# Patient Record
Sex: Male | Born: 1980
Health system: Southern US, Community
[De-identification: ages and names within clinical notes are randomized; demographics above are authoritative.]

## PROBLEM LIST (undated history)

## (undated) DIAGNOSIS — M109 Gout, unspecified: Secondary | ICD-10-CM

## (undated) DIAGNOSIS — E1169 Type 2 diabetes mellitus with other specified complication: Secondary | ICD-10-CM

## (undated) DIAGNOSIS — E785 Hyperlipidemia, unspecified: Secondary | ICD-10-CM

## (undated) DIAGNOSIS — Z0000 Encounter for general adult medical examination without abnormal findings: Principal | ICD-10-CM

## (undated) DIAGNOSIS — T7840XA Allergy, unspecified, initial encounter: Secondary | ICD-10-CM

## (undated) DIAGNOSIS — M1120 Other chondrocalcinosis, unspecified site: Secondary | ICD-10-CM

## (undated) DIAGNOSIS — E119 Type 2 diabetes mellitus without complications: Secondary | ICD-10-CM

## (undated) HISTORY — DX: Hyperlipidemia, unspecified: E78.5

## (undated) HISTORY — DX: Encounter for general adult medical examination without abnormal findings: Z00.00

## (undated) HISTORY — PX: KNEE SURGERY: SHX244

## (undated) HISTORY — DX: Gout, unspecified: M10.9

## (undated) HISTORY — DX: Other chondrocalcinosis, unspecified site: M11.20

## (undated) HISTORY — DX: Allergy, unspecified, initial encounter: T78.40XA

## (undated) HISTORY — DX: Type 2 diabetes mellitus with other specified complication: E11.69

## (undated) HISTORY — DX: Type 2 diabetes mellitus without complications: E11.9

---

## 2008-03-02 ENCOUNTER — Ambulatory Visit: Payer: Self-pay | Admitting: Internal Medicine

## 2008-03-02 LAB — CONVERTED CEMR LAB
ALT: 27 units/L (ref 0–53)
AST: 21 units/L (ref 0–37)
Albumin: 4.3 g/dL (ref 3.5–5.2)
Alkaline Phosphatase: 50 units/L (ref 39–117)
BUN: 7 mg/dL (ref 6–23)
Basophils Relative: 0.5 % (ref 0.0–1.0)
Bilirubin Urine: NEGATIVE
CO2: 29 meq/L (ref 19–32)
Calcium: 9.5 mg/dL (ref 8.4–10.5)
Chloride: 106 meq/L (ref 96–112)
Creatinine, Ser: 1.1 mg/dL (ref 0.4–1.5)
Hemoglobin: 15.7 g/dL (ref 13.0–17.0)
Ketones, ur: NEGATIVE mg/dL
Leukocytes, UA: NEGATIVE
Monocytes Absolute: 0.7 10*3/uL (ref 0.2–0.7)
Monocytes Relative: 9.1 % (ref 3.0–11.0)
Potassium: 3.9 meq/L (ref 3.5–5.1)
RBC: 5.25 M/uL (ref 4.22–5.81)
RDW: 12.2 % (ref 11.5–14.6)
Specific Gravity, Urine: 1.02 (ref 1.000–1.03)
Total Bilirubin: 1.7 mg/dL — ABNORMAL HIGH (ref 0.3–1.2)
Total Protein, Urine: NEGATIVE mg/dL
Total Protein: 7.4 g/dL (ref 6.0–8.3)
Urobilinogen, UA: 0.2 (ref 0.0–1.0)
VLDL: 25 mg/dL (ref 0–40)
pH: 6 (ref 5.0–8.0)

## 2008-03-16 ENCOUNTER — Ambulatory Visit: Payer: Self-pay | Admitting: Internal Medicine

## 2009-04-21 ENCOUNTER — Ambulatory Visit: Payer: Self-pay | Admitting: Internal Medicine

## 2009-04-21 DIAGNOSIS — K625 Hemorrhage of anus and rectum: Secondary | ICD-10-CM | POA: Insufficient documentation

## 2009-05-31 ENCOUNTER — Ambulatory Visit: Payer: Self-pay | Admitting: Internal Medicine

## 2009-05-31 LAB — CONVERTED CEMR LAB
Fecal Occult Blood: NEGATIVE
OCCULT 3: NEGATIVE
OCCULT 4: NEGATIVE
OCCULT 5: NEGATIVE

## 2009-06-01 ENCOUNTER — Encounter: Payer: Self-pay | Admitting: Internal Medicine

## 2009-08-26 ENCOUNTER — Ambulatory Visit: Payer: Self-pay | Admitting: Diagnostic Radiology

## 2009-08-26 ENCOUNTER — Emergency Department (HOSPITAL_BASED_OUTPATIENT_CLINIC_OR_DEPARTMENT_OTHER): Admission: EM | Admit: 2009-08-26 | Discharge: 2009-08-26 | Payer: Self-pay | Admitting: Emergency Medicine

## 2009-08-30 ENCOUNTER — Ambulatory Visit: Payer: Self-pay | Admitting: Internal Medicine

## 2009-08-30 DIAGNOSIS — J45909 Unspecified asthma, uncomplicated: Secondary | ICD-10-CM | POA: Insufficient documentation

## 2009-09-30 ENCOUNTER — Ambulatory Visit: Payer: Self-pay | Admitting: Internal Medicine

## 2009-10-17 ENCOUNTER — Ambulatory Visit: Payer: Self-pay | Admitting: Internal Medicine

## 2009-10-19 ENCOUNTER — Encounter: Payer: Self-pay | Admitting: Internal Medicine

## 2009-11-18 ENCOUNTER — Ambulatory Visit: Payer: Self-pay | Admitting: Internal Medicine

## 2009-11-21 ENCOUNTER — Telehealth: Payer: Self-pay | Admitting: Internal Medicine

## 2011-04-18 ENCOUNTER — Encounter: Payer: Self-pay | Admitting: Internal Medicine

## 2011-04-18 ENCOUNTER — Ambulatory Visit: Payer: Self-pay | Admitting: Internal Medicine

## 2011-04-19 ENCOUNTER — Encounter: Payer: Self-pay | Admitting: Internal Medicine

## 2011-04-19 ENCOUNTER — Ambulatory Visit (INDEPENDENT_AMBULATORY_CARE_PROVIDER_SITE_OTHER): Payer: 59 | Admitting: Internal Medicine

## 2011-04-19 DIAGNOSIS — J45909 Unspecified asthma, uncomplicated: Secondary | ICD-10-CM

## 2011-04-19 DIAGNOSIS — R7309 Other abnormal glucose: Secondary | ICD-10-CM

## 2011-04-19 DIAGNOSIS — Z0000 Encounter for general adult medical examination without abnormal findings: Secondary | ICD-10-CM

## 2011-04-19 DIAGNOSIS — E785 Hyperlipidemia, unspecified: Secondary | ICD-10-CM

## 2011-04-19 DIAGNOSIS — Z833 Family history of diabetes mellitus: Secondary | ICD-10-CM

## 2011-04-19 LAB — LIPID PANEL
Cholesterol: 179 mg/dL (ref 0–200)
Total CHOL/HDL Ratio: 3.8 Ratio

## 2011-04-19 NOTE — Patient Instructions (Signed)
Follow up as needed

## 2011-04-20 ENCOUNTER — Other Ambulatory Visit: Payer: Self-pay | Admitting: Internal Medicine

## 2011-04-20 ENCOUNTER — Telehealth: Payer: Self-pay | Admitting: *Deleted

## 2011-04-20 DIAGNOSIS — R7309 Other abnormal glucose: Secondary | ICD-10-CM

## 2011-04-20 LAB — BASIC METABOLIC PANEL WITH GFR
BUN: 14 mg/dL (ref 6–23)
Calcium: 9.2 mg/dL (ref 8.4–10.5)
Creat: 1.35 mg/dL (ref 0.40–1.50)
GFR, Est Non African American: >60 mL/min (ref >60)

## 2011-04-20 LAB — HEMOGLOBIN A1C

## 2011-04-20 NOTE — Telephone Encounter (Signed)
Received call from [place] at [facility] stating they did not receive the lavender tube for the Hgb A1c that was ordered on pt yesterday. I asked her to please check their storage areas again and let me know the outcome. Spoke to Troy at the [facility] and he states he remembers drawing the specimen.

## 2011-04-24 NOTE — Telephone Encounter (Signed)
Left message with [place] to have Archie Patten return my call with status of specimen.

## 2011-04-25 NOTE — Telephone Encounter (Signed)
Spoke to [employer] at [place] that states they did not find the lavender tube for hgba1c ordered on 04/19/11. Darlene was previously informed by the lab that they would call the pt for redraw.

## 2011-05-03 DIAGNOSIS — Z0000 Encounter for general adult medical examination without abnormal findings: Secondary | ICD-10-CM | POA: Insufficient documentation

## 2011-05-03 DIAGNOSIS — Z833 Family history of diabetes mellitus: Secondary | ICD-10-CM | POA: Insufficient documentation

## 2011-05-03 NOTE — Progress Notes (Signed)
  Subjective:    Patient ID: Steven Powell, male    DOB: [birth year], 30 y.o.   MRN: [id number]  HPI  30 y/o male with hx of RAD for routine follow He denies symptoms of asthma.  No wheezing, cough, or dyspnea.  He exercises on regular basis.  Fam hx of diabetes.  He is prudent with carb intake.    Review of Systems     Constitutional: Negative for activity change, appetite change and unexpected weight change.  Eyes: Negative for visual disturbance.  Respiratory: Negative for cough, chest tightness and shortness of breath.   Cardiovascular: Negative for chest pain.  Genitourinary: Negative for difficulty urinating.  Neurological: Negative for headaches.  Gastrointestinal: Negative for abdominal pain, heartburn melena or hematochezia Psych: Negative for depression or anxiety Endo:  No polyuria or polydypsia     Past Medical History  Diagnosis Date  . Asthma     childhood    History   Social History  . Marital Status: Single    Spouse Name: N/A    Number of Children: N/A  . Years of Education: N/A   Occupational History  . Not on file.   Social History Main Topics  . Smoking status: Never Smoker   . Smokeless tobacco: Not on file  . Alcohol Use: Not on file  . Drug Use: Not on file  . Sexually Active: Not on file   Other Topics Concern  . Not on file   Social History Narrative   Occupation: currently working for [employer] Alcohol use-yes (social)    No past surgical history on file.  Family History  Problem Relation Age of Onset  . Diabetes    . Hyperlipidemia    . Hypertension      No Known Allergies  Current Outpatient Prescriptions on File Prior to Visit  Medication Sig Dispense Refill  . albuterol (PROVENTIL HFA) 108 (90 BASE) MCG/ACT inhaler Inhale 2 puffs into the lungs every 4 (four) hours as needed.        . beclomethasone (QVAR) 40 MCG/ACT inhaler Inhale 2 puffs into the lungs 2 (two) times daily.          BP 110/80   Pulse 85  Temp(Src) 98.1 F (36.7 C) (Oral)  Resp 18  Ht 5\' 4"  (1.626 m)  Wt 170 lb (77.111 kg)  BMI 29.18 kg/m2    Objective:   Physical Exam  Constitutional: Appears well-developed and well-nourished. No distress.  HENT:  Head: Normocephalic and atraumatic.  Right Ear: External ear normal.  Left Ear: External ear normal.  Mouth/Throat: Oropharynx is clear and moist.  Eyes: Conjunctivae are normal. Pupils are equal, round, and reactive to light.  Neck: Normal range of motion. Neck supple. No thyromegaly present.       No carotid bruit  Cardiovascular: Normal rate, regular rhythm and normal heart sounds.  Exam reveals no gallop and no friction rub.   No murmur heard. Pulmonary/Chest: Effort normal and breath sounds normal. He has no wheezes. He has no rales.  Abdominal: Soft. Bowel sounds are normal. He exhibits no mass. There is no tenderness.  Neurological: Alert. No cranial nerve deficit.  Skin: Skin is warm and dry.  Psychiatric: Normal mood and affect. Behavior is normal.         Assessment & Plan:

## 2011-05-03 NOTE — Assessment & Plan Note (Signed)
Reviewed adult health maintenance protocols.  

## 2011-05-03 NOTE — Assessment & Plan Note (Signed)
Encouraged healthy diet and regular exercise   

## 2011-05-03 NOTE — Assessment & Plan Note (Signed)
Resolved

## 2011-05-14 ENCOUNTER — Telehealth: Payer: Self-pay | Admitting: Internal Medicine

## 2011-05-14 NOTE — Telephone Encounter (Signed)
Copy of labs mailed to patients address on file.

## 2011-05-14 NOTE — Telephone Encounter (Signed)
Pt requesting last lab work results to be mailed to him.

## 2011-10-05 ENCOUNTER — Encounter: Payer: Self-pay | Admitting: Internal Medicine

## 2011-10-05 ENCOUNTER — Ambulatory Visit (INDEPENDENT_AMBULATORY_CARE_PROVIDER_SITE_OTHER): Payer: 59 | Admitting: Internal Medicine

## 2011-10-05 DIAGNOSIS — R109 Unspecified abdominal pain: Secondary | ICD-10-CM

## 2011-10-05 DIAGNOSIS — R1030 Lower abdominal pain, unspecified: Secondary | ICD-10-CM

## 2011-10-05 NOTE — Progress Notes (Signed)
  Subjective:    Patient ID: Steven Powell, male    DOB: 1981/04/07, 30 y.o.   MRN: [id number]  HPI Pt presents to clinic for evaluation of groin pain. Notes 4d h/o left groin pain rarely (twice) radiating to left scrotum. Pain began after exercising abdominal muscles and leg lifts. Denies any obvious mass and has no persistent scrotal or testicular pain. Denies abdominal pain, fever, chills, nausea, vomitting or urinary sx's such as dysuria, frequency or hematuria. Describes pain as mild 3/10. Has improved mildly since onset. Taking no medication for the problem. No other alleviating or exacerbating factors. No other complaints.  Past Medical History  Diagnosis Date  . Asthma     childhood   No past surgical history on file.  reports that he has never smoked. He has never used smokeless tobacco. He reports that he drinks alcohol. He reports that he does not use illicit drugs. family history includes Diabetes in an unspecified family member; Hyperlipidemia in an unspecified family member; and Hypertension in an unspecified family member. No Known Allergies    Review of Systems see hpi     Objective:   Physical Exam  Nursing note and vitals reviewed. Constitutional: He appears well-developed and well-nourished. No distress.  HENT:  Head: Normocephalic and atraumatic.  Eyes: Conjunctivae are normal. No scleral icterus.  Genitourinary:       Bilaterally descended testes without nodularity or tenderness. No scrotal mass. No canal hernia noted on valsalva. No inguinal adenopathy or tenderness. +mild firm area just left of bladder which represents area of pain. No mass on valsalva.  Skin: Skin is warm and dry. He is not diaphoretic.  Psychiatric: He has a normal mood and affect.          Assessment & Plan:

## 2011-10-05 NOTE — Assessment & Plan Note (Signed)
UA obtained and nl. No obvious hernia or mass on exam. Consider msk etiology given timing of onset. Recommend observation without further exercise for one week. If sx's worsen or do not improve over that time period recommend CT imaging.

## 2011-11-15 ENCOUNTER — Ambulatory Visit (INDEPENDENT_AMBULATORY_CARE_PROVIDER_SITE_OTHER): Payer: 59 | Admitting: Internal Medicine

## 2011-11-15 ENCOUNTER — Ambulatory Visit (HOSPITAL_BASED_OUTPATIENT_CLINIC_OR_DEPARTMENT_OTHER)
Admission: RE | Admit: 2011-11-15 | Discharge: 2011-11-15 | Disposition: A | Discharge status: Home / Self Care | Payer: 59 | Source: Ambulatory Visit | Attending: Internal Medicine | Admitting: Internal Medicine

## 2011-11-15 ENCOUNTER — Encounter: Payer: Self-pay | Admitting: Internal Medicine

## 2011-11-15 VITALS — BP 100/60 | HR 82 | Temp 98.5°F | Resp 18 | Ht 64.0 in | Wt 178.0 lb

## 2011-11-15 DIAGNOSIS — M25579 Pain in unspecified ankle and joints of unspecified foot: Secondary | ICD-10-CM | POA: Insufficient documentation

## 2011-11-15 MED ORDER — DICLOFENAC SODIUM 75 MG PO TBEC: DELAYED_RELEASE_TABLET | ORAL | Status: AC

## 2011-11-27 DIAGNOSIS — M25579 Pain in unspecified ankle and joints of unspecified foot: Secondary | ICD-10-CM | POA: Insufficient documentation

## 2011-11-27 NOTE — Progress Notes (Signed)
  Subjective:    Patient ID: Steven Powell, male    DOB: 1981/04/08, 30 y.o.   MRN: [id number]  HPI Pt presents to clinic for evalution of ankle pain. Notes recent left ankle pain that occurred after running/exercise. No injury/trauma. attemped intermittent otc nsaid. No instability. Also notes chronic intermittent mild right ankle pain without injury. No other alleviating or exacerbating factors. No other complaints.  Past Medical History  Diagnosis Date  . Asthma     childhood   No past surgical history on file.  reports that he has never smoked. He has never used smokeless tobacco. He reports that he drinks alcohol. He reports that he does not use illicit drugs. family history includes Diabetes in an unspecified family member; Hyperlipidemia in an unspecified family member; and Hypertension in an unspecified family member. No Known Allergies     Review of Systems see hpi     Objective:   Physical Exam  Nursing note and vitals reviewed. Constitutional: He appears well-developed and well-nourished. No distress.  HENT:  Head: Normocephalic and atraumatic.  Eyes: Conjunctivae are normal. No scleral icterus.  Musculoskeletal:       Gait nl. Left lateral malleous with mild tenderness over distal fibula. No ST swelling, erythema or warmth.   Neurological: He is alert.  Skin: Skin is warm and dry. He is not diaphoretic.  Psychiatric: He has a normal mood and affect.          Assessment & Plan:

## 2011-11-27 NOTE — Assessment & Plan Note (Signed)
Obtain plain radiographs of ankles. Attempt nsaid with food and no other nsaids. Followup if no improvement or worsening.

## 2012-12-31 HISTORY — PX: KNEE SURGERY: SHX244

## 2013-02-18 ENCOUNTER — Encounter: Payer: Self-pay | Admitting: Internal Medicine

## 2013-02-18 ENCOUNTER — Ambulatory Visit (INDEPENDENT_AMBULATORY_CARE_PROVIDER_SITE_OTHER): Payer: 59 | Admitting: Internal Medicine

## 2013-02-18 VITALS — BP 112/84 | Temp 99.0°F | Wt 181.0 lb

## 2013-02-18 DIAGNOSIS — R52 Pain, unspecified: Secondary | ICD-10-CM

## 2013-02-18 DIAGNOSIS — J069 Acute upper respiratory infection, unspecified: Secondary | ICD-10-CM | POA: Insufficient documentation

## 2013-02-18 LAB — POCT INFLUENZA A/B
Influenza A, POC: NEGATIVE
Influenza B, POC: NEGATIVE

## 2013-02-18 MED ORDER — HYDROCODONE-HOMATROPINE 5-1.5 MG/5ML PO SYRP: 5.0000 mL | ORAL_SOLUTION | Freq: Three times a day (TID) | ORAL | Status: DC | PRN

## 2013-02-18 NOTE — Progress Notes (Signed)
  Subjective:    Patient ID: Steven Powell, male    DOB: 05-Apr-1981, 32 y.o.   MRN: [id number]  HPI  32 year old Asian male with history of reactive airway disease complains of nasal congestion, cough and stuffy nose over last 1-2 days. Patient also has associated fatigue and skin feels sensitive. He is experienced mild chills and low-grade fever. He denies any sick contacts.   Review of Systems Negative for shortness of breath  Past Medical History  Diagnosis Date  . Asthma     childhood    History   Social History  . Marital Status: Single    Spouse Name: N/A    Number of Children: N/A  . Years of Education: N/A   Occupational History  . Not on file.   Social History Main Topics  . Smoking status: Never Smoker   . Smokeless tobacco: Never Used  . Alcohol Use: Yes  . Drug Use: No  . Sexually Active: Not on file   Other Topics Concern  . Not on file   Social History Narrative   Occupation: currently working for [employer]   Single   Never Smoked    Alcohol use-yes (social)             No past surgical history on file.  Family History  Problem Relation Age of Onset  . Diabetes    . Hyperlipidemia    . Hypertension      No Known Allergies  No current outpatient prescriptions on file prior to visit.   No current facility-administered medications on file prior to visit.    BP 112/84  Temp(Src) 99 F (37.2 C) (Oral)  Wt 181 lb (82.101 kg)  BMI 31.05 kg/m2       Objective:   Physical Exam  Constitutional: He appears well-developed and well-nourished.  HENT:  Head: Normocephalic and atraumatic.  Right and left tympanic membranes slightly retracted, oropharyngeal erythema, oropharyngeal crowding  Neck: Neck supple.  No neck tenderness  Cardiovascular: Normal rate, regular rhythm and normal heart sounds.   Pulmonary/Chest: Effort normal and breath sounds normal. He has no wheezes.  Abdominal: Soft. Bowel sounds are normal. There is no tenderness.   Lymphadenopathy:    He has no cervical adenopathy.  Psychiatric: He has a normal mood and affect.          Assessment & Plan:

## 2013-02-18 NOTE — Patient Instructions (Addendum)
Gargle with warm salt water 2-3 times per day Use nasal saline spray as directed Please contact our office if your symptoms do not improve or gets worse.

## 2013-02-18 NOTE — Assessment & Plan Note (Signed)
32 year old Asian male with signs and symptoms of viral upper respiratory infection. I recommended symptomatic treatment. Use hycodan for cough.  Patient advised to call office if symptoms persist or worsen.

## 2014-08-30 ENCOUNTER — Ambulatory Visit (INDEPENDENT_AMBULATORY_CARE_PROVIDER_SITE_OTHER): Payer: 59 | Admitting: Physician Assistant

## 2014-08-30 ENCOUNTER — Other Ambulatory Visit: Payer: Self-pay | Admitting: Physician Assistant

## 2014-08-30 ENCOUNTER — Encounter: Payer: Self-pay | Admitting: Physician Assistant

## 2014-08-30 VITALS — BP 115/70 | HR 81 | Temp 98.0°F | Wt 188.0 lb

## 2014-08-30 DIAGNOSIS — S99919A Unspecified injury of unspecified ankle, initial encounter: Secondary | ICD-10-CM

## 2014-08-30 DIAGNOSIS — Z5189 Encounter for other specified aftercare: Secondary | ICD-10-CM

## 2014-08-30 DIAGNOSIS — S8992XD Unspecified injury of left lower leg, subsequent encounter: Secondary | ICD-10-CM

## 2014-08-30 DIAGNOSIS — S8990XA Unspecified injury of unspecified lower leg, initial encounter: Secondary | ICD-10-CM

## 2014-08-30 DIAGNOSIS — S99929A Unspecified injury of unspecified foot, initial encounter: Secondary | ICD-10-CM

## 2014-08-30 NOTE — Progress Notes (Signed)
Patient presents to clinic today c/o chronic intermittent left knee pain after a twisting injury sustained 2 months ago.  Patient endorses twisting knee during a fall.  Had some mild immediate pain.  Was evaluated at an Urgent Care.  X-rays were obtained at that visit but were unremarkable.  Patient stated pain gradually presented itself over the next few days after injury. Endorses some associated swelling. Denies ecchymosis. Pain and swelling had improved. Patient now with intermittent pain and crepitus of left knee. Denies recurrent joint effusion. Is concerned, because he has been favoring his right leg and is now starting to have some right hip discomfort. Patient has not taken anything for his symptoms.  Past Medical History  Diagnosis Date  . Asthma     childhood    Current Outpatient Prescriptions on File Prior to Visit  Medication Sig Dispense Refill  . HYDROcodone-homatropine (HYCODAN) 5-1.5 MG/5ML syrup Take 5 mLs by mouth every 8 (eight) hours as needed for cough.  120 mL  0   No current facility-administered medications on file prior to visit.    No Known Allergies  Family History  Problem Relation Age of Onset  . Diabetes    . Hyperlipidemia    . Hypertension      History   Social History  . Marital Status: Single    Spouse Name: N/A    Number of Children: N/A  . Years of Education: N/A   Social History Main Topics  . Smoking status: Never Smoker   . Smokeless tobacco: Never Used  . Alcohol Use: Yes  . Drug Use: No  . Sexual Activity: Not on file   Other Topics Concern  . Not on file   Social History Narrative   Occupation: currently working for [employer]   Single   Never Smoked    Alcohol use-yes (social)            Review of Systems - See HPI.  All other ROS are negative.  BP 115/70  Pulse 81  Temp(Src) 98 F (36.7 C)  Wt 188 lb (85.276 kg)  SpO2 99%  Physical Exam  Vitals reviewed. Constitutional: He is oriented to person, place, and time  and well-developed, well-nourished, and in no distress.  HENT:  Head: Normocephalic and atraumatic.  Eyes: Conjunctivae are normal.  Cardiovascular: Normal rate, regular rhythm, normal heart sounds and intact distal pulses.   Pulmonary/Chest: Effort normal and breath sounds normal. No respiratory distress. He has no wheezes.  Musculoskeletal:       Right knee: Normal.       Left knee: He exhibits abnormal meniscus. He exhibits normal range of motion, no swelling, no effusion, no ecchymosis, normal alignment, no LCL laxity, no bony tenderness and no MCL laxity. Tenderness found. Medial joint line tenderness noted. No lateral joint line, no MCL, no LCL and no patellar tendon tenderness noted.       Right ankle: Normal.       Left ankle: Normal.  Neurological: He is alert and oriented to person, place, and time.  Skin: Skin is warm and dry. No rash noted.  Psychiatric: Affect normal.   No results found for this or any previous visit (from the past 2160 hour(s)).  Assessment/Plan: Knee injury Concern for possible meniscal tear.  Knee brace to wear daily.  Keep leg elevated.  Tylenol or Ibuprofen if needed for pain.  Will obtain MRI to further assess joint.  Will likely need referral to Orthopedic Surgery.

## 2014-08-30 NOTE — Assessment & Plan Note (Signed)
Concern for possible meniscal tear.  Knee brace to wear daily.  Keep leg elevated.  Tylenol or Ibuprofen if needed for pain.  Will obtain MRI to further assess joint.  Will likely need referral to Orthopedic Surgery.

## 2014-08-30 NOTE — Progress Notes (Signed)
Pre visit review using our clinic review tool, if applicable. No additional management support is needed unless otherwise documented below in the visit note. 

## 2014-08-30 NOTE — Patient Instructions (Signed)
Please wear supportive knee brace.  Wear supportive tennis shoes.  Avoid overexertion and resistance training on the knees.  You will be contacted to schedule an MRI to further evaluate.  Take tylenol or Ibuprofen for pian. Keep leg elevated while resting at home.

## 2014-09-01 ENCOUNTER — Ambulatory Visit (INDEPENDENT_AMBULATORY_CARE_PROVIDER_SITE_OTHER): Payer: 59

## 2014-09-01 ENCOUNTER — Telehealth: Payer: Self-pay | Admitting: Physician Assistant

## 2014-09-01 DIAGNOSIS — X58XXXA Exposure to other specified factors, initial encounter: Secondary | ICD-10-CM

## 2014-09-01 DIAGNOSIS — S83242A Other tear of medial meniscus, current injury, left knee, initial encounter: Secondary | ICD-10-CM

## 2014-09-01 DIAGNOSIS — IMO0002 Reserved for concepts with insufficient information to code with codable children: Secondary | ICD-10-CM

## 2014-09-01 DIAGNOSIS — S8992XD Unspecified injury of left lower leg, subsequent encounter: Secondary | ICD-10-CM

## 2014-09-01 NOTE — Telephone Encounter (Signed)
Pt notified  Pt verbalized understanding

## 2014-09-01 NOTE — Telephone Encounter (Signed)
MRI shows significant medial meniscus tear.  Avoid overexertion.  Continue knee brace.  I am setting him up with an Orthopedic Surgeon for further evaluation and treatment.

## 2014-09-04 ENCOUNTER — Ambulatory Visit (HOSPITAL_BASED_OUTPATIENT_CLINIC_OR_DEPARTMENT_OTHER): Payer: 59

## 2014-09-24 ENCOUNTER — Ambulatory Visit: Payer: 59 | Admitting: Family Medicine

## 2014-11-16 ENCOUNTER — Ambulatory Visit: Payer: 59 | Attending: Orthopedic Surgery | Admitting: Physical Therapy

## 2014-11-16 DIAGNOSIS — R269 Unspecified abnormalities of gait and mobility: Secondary | ICD-10-CM | POA: Diagnosis not present

## 2014-11-16 DIAGNOSIS — M25562 Pain in left knee: Secondary | ICD-10-CM | POA: Diagnosis not present

## 2014-11-16 DIAGNOSIS — Z96652 Presence of left artificial knee joint: Secondary | ICD-10-CM | POA: Diagnosis not present

## 2014-11-16 DIAGNOSIS — M25662 Stiffness of left knee, not elsewhere classified: Secondary | ICD-10-CM | POA: Diagnosis not present

## 2014-11-16 DIAGNOSIS — M6281 Muscle weakness (generalized): Secondary | ICD-10-CM | POA: Insufficient documentation

## 2014-12-01 ENCOUNTER — Ambulatory Visit: Payer: 59 | Attending: Orthopedic Surgery | Admitting: Rehabilitation

## 2014-12-01 DIAGNOSIS — M6281 Muscle weakness (generalized): Secondary | ICD-10-CM | POA: Insufficient documentation

## 2014-12-01 DIAGNOSIS — R269 Unspecified abnormalities of gait and mobility: Secondary | ICD-10-CM | POA: Insufficient documentation

## 2014-12-01 DIAGNOSIS — M25662 Stiffness of left knee, not elsewhere classified: Secondary | ICD-10-CM | POA: Diagnosis not present

## 2014-12-01 DIAGNOSIS — Z96652 Presence of left artificial knee joint: Secondary | ICD-10-CM | POA: Diagnosis not present

## 2014-12-01 DIAGNOSIS — M25562 Pain in left knee: Secondary | ICD-10-CM | POA: Diagnosis present

## 2014-12-08 ENCOUNTER — Encounter: Payer: 59 | Admitting: Physical Therapy

## 2014-12-15 ENCOUNTER — Encounter: Payer: 59 | Admitting: Physical Therapy

## 2014-12-22 ENCOUNTER — Encounter: Payer: 59 | Admitting: Rehabilitation

## 2016-02-14 ENCOUNTER — Encounter (HOSPITAL_COMMUNITY): Payer: Self-pay | Admitting: Emergency Medicine

## 2016-02-14 ENCOUNTER — Emergency Department (HOSPITAL_COMMUNITY): Payer: 59

## 2016-02-14 ENCOUNTER — Emergency Department (HOSPITAL_COMMUNITY)
Admission: EM | Admit: 2016-02-14 | Discharge: 2016-02-14 | Disposition: A | Discharge status: Home / Self Care | Payer: 59 | Attending: Emergency Medicine | Admitting: Emergency Medicine

## 2016-02-14 DIAGNOSIS — S8992XA Unspecified injury of left lower leg, initial encounter: Secondary | ICD-10-CM | POA: Diagnosis not present

## 2016-02-14 DIAGNOSIS — M25462 Effusion, left knee: Secondary | ICD-10-CM | POA: Insufficient documentation

## 2016-02-14 DIAGNOSIS — Y9389 Activity, other specified: Secondary | ICD-10-CM | POA: Insufficient documentation

## 2016-02-14 DIAGNOSIS — X58XXXA Exposure to other specified factors, initial encounter: Secondary | ICD-10-CM | POA: Diagnosis not present

## 2016-02-14 DIAGNOSIS — M25562 Pain in left knee: Secondary | ICD-10-CM

## 2016-02-14 DIAGNOSIS — Z9889 Other specified postprocedural states: Secondary | ICD-10-CM | POA: Insufficient documentation

## 2016-02-14 DIAGNOSIS — J45909 Unspecified asthma, uncomplicated: Secondary | ICD-10-CM | POA: Insufficient documentation

## 2016-02-14 DIAGNOSIS — Y998 Other external cause status: Secondary | ICD-10-CM | POA: Diagnosis not present

## 2016-02-14 DIAGNOSIS — Y9289 Other specified places as the place of occurrence of the external cause: Secondary | ICD-10-CM | POA: Insufficient documentation

## 2016-02-14 MED ORDER — INDOMETHACIN 50 MG PO CAPS: 50.0000 mg | ORAL_CAPSULE | Freq: Two times a day (BID) | ORAL | Status: DC

## 2016-02-14 NOTE — ED Provider Notes (Signed)
CSN: [id number]     Arrival date & time 02/14/16  0039 History   First MD Initiated Contact with Patient 02/14/16 0110     Chief Complaint  Patient presents with  . Knee Pain     (Consider location/radiation/quality/duration/timing/severity/associated sxs/prior Treatment) HPI Comments: Patient with history of meniscus tear. He states he has been having bouts of pain since undergoing surgical repair by Dr. Gladstone Lighter in 2015. He has followed up with Dr. Gladstone Lighter to address ongoing pain.  He states that pain recently intensified, and that his knee "pops and creeks".  Patient is a 35 y.o. male presenting with knee pain. The history is provided by the patient. No language interpreter was used.  Knee Pain Location:  Knee Injury: no   Knee location:  L knee Pain details:    Quality:  Throbbing   Radiates to:  Does not radiate   Severity:  Mild   Onset quality:  Gradual   Duration:  3 days   Timing:  Intermittent   Progression:  Waxing and waning Chronicity:  Recurrent Prior injury to area:  Yes Associated symptoms: no decreased ROM     Past Medical History  Diagnosis Date  . Asthma     childhood   Past Surgical History  Procedure Laterality Date  . Knee surgery     Family History  Problem Relation Age of Onset  . Diabetes    . Hyperlipidemia    . Hypertension     Social History  Substance Use Topics  . Smoking status: Never Smoker   . Smokeless tobacco: Never Used  . Alcohol Use: Yes    Review of Systems  Musculoskeletal: Positive for arthralgias.  All other systems reviewed and are negative.     Allergies  Review of patient's allergies indicates no known allergies.  Home Medications   Prior to Admission medications   Medication Sig Start Date End Date Taking? Authorizing Provider  HYDROcodone-homatropine (HYCODAN) 5-1.5 MG/5ML syrup Take 5 mLs by mouth every 8 (eight) hours as needed for cough. 02/18/13   Doe-Hyun R Shawna Orleans, DO   BP 140/99 mmHg  Pulse 115   Temp(Src) 99.7 F (37.6 C) (Oral)  Resp 18  Ht 5\' 4"  (1.626 m)  Wt 83.915 kg  BMI 31.74 kg/m2  SpO2 97% Physical Exam  Constitutional: He is oriented to person, place, and time. He appears well-developed and well-nourished.  HENT:  Head: Normocephalic.  Eyes: Pupils are equal, round, and reactive to light.  Neck: Neck supple.  Cardiovascular: Normal rate and regular rhythm.   Pulmonary/Chest: Effort normal and breath sounds normal.  Abdominal: Soft.  Musculoskeletal: He exhibits edema and tenderness.       Left knee: He exhibits swelling. He exhibits no erythema.  Lymphadenopathy:    He has no cervical adenopathy.  Neurological: He is alert and oriented to person, place, and time.  Skin: Skin is warm and dry.  Psychiatric: He has a normal mood and affect.  Nursing note and vitals reviewed.   ED Course  Procedures (including critical care time) Labs Review Labs Reviewed - No data to display  Imaging Review Dg Knee Complete 4 Views Left  02/14/2016  CLINICAL DATA:  35 year old male with pain and swelling of the left knee. EXAM: LEFT KNEE - COMPLETE 4+ VIEW COMPARISON:  MRI dated 09/01/2014 stop FINDINGS: There is no acute fracture or dislocation. 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 There is narrowing of the medial and lateral compartments. No significant joint effusion.  Mild soft tissue swelling of the knee. IMPRESSION: No acute fracture or dislocation. Electronically Signed   By: Anner Crete M.D.   On: 02/14/2016 01:19   I have personally reviewed and evaluated these images and lab results as part of my medical decision-making.   EKG Interpretation None     Radiology results reviewed and shared with patient. No joint instability on exam. Able to extend and flex. Joint not hot to touch. MDM   Final diagnoses:  None    Left knee pain. Knee sleeve. Patient has crutches. Will start on short coarse of indocin. Follow-up with Dr. Gladstone Lighter. Care  instructions provided.    Etta Quill, NP 02/14/16 0157  Orpah Greek, MD 02/14/16 [phone]

## 2016-02-14 NOTE — ED Notes (Signed)
Pt. reports left lateral/posterior knee pain onset this evening after working out with mild swelling .

## 2016-02-14 NOTE — Discharge Instructions (Signed)

## 2016-06-06 ENCOUNTER — Telehealth: Payer: Self-pay | Admitting: Family Medicine

## 2016-06-06 ENCOUNTER — Encounter: Payer: Self-pay | Admitting: Medical

## 2016-06-06 ENCOUNTER — Ambulatory Visit (INDEPENDENT_AMBULATORY_CARE_PROVIDER_SITE_OTHER): Payer: 59 | Admitting: Medical

## 2016-06-06 ENCOUNTER — Telehealth: Payer: Self-pay | Admitting: Medical

## 2016-06-06 ENCOUNTER — Ambulatory Visit (HOSPITAL_BASED_OUTPATIENT_CLINIC_OR_DEPARTMENT_OTHER)
Admission: RE | Admit: 2016-06-06 | Discharge: 2016-06-06 | Disposition: A | Discharge status: Home / Self Care | Payer: 59 | Source: Ambulatory Visit | Attending: Medical | Admitting: Medical

## 2016-06-06 VITALS — BP 112/82 | HR 103 | Temp 97.8°F | Ht 64.0 in | Wt 183.8 lb

## 2016-06-06 DIAGNOSIS — M25522 Pain in left elbow: Secondary | ICD-10-CM | POA: Diagnosis not present

## 2016-06-06 DIAGNOSIS — S46312A Strain of muscle, fascia and tendon of triceps, left arm, initial encounter: Secondary | ICD-10-CM | POA: Diagnosis not present

## 2016-06-06 MED ORDER — DICLOFENAC SODIUM 75 MG PO TBEC: 75.0000 mg | DELAYED_RELEASE_TABLET | Freq: Two times a day (BID) | ORAL | Status: DC

## 2016-06-06 MED ORDER — RANITIDINE HCL 150 MG PO CAPS: 150.0000 mg | ORAL_CAPSULE | Freq: Two times a day (BID) | ORAL | Status: DC

## 2016-06-06 MED ORDER — KETOROLAC TROMETHAMINE 60 MG/2ML IM SOLN
60.0000 mg | Freq: Once | INTRAMUSCULAR | Status: AC
  Administered 2016-06-06: 60 mg via INTRAMUSCULAR

## 2016-06-06 NOTE — Telephone Encounter (Signed)
Referral to sports medicine placed

## 2016-06-06 NOTE — Patient Instructions (Addendum)
For your elbow pain will get xray today.  For pain and inflammation we gave toradol 60 mg im today.  Start tomorrow diclofenac. If this upsets you stomach start zantac. Stop your ibuprofen-famotidine combination medication.  Apply ice twice daily and could use otc tennis elbow brace. No upper body exercises for at least 4-5 days. If pain is persisting by early next week then could refer to sports medicine to expedite recovery.  Follow up in 7 days or as needed

## 2016-06-06 NOTE — Telephone Encounter (Signed)
Relation to [id number] Call back number:[phone] Pharmacy:  Reason for call:  Patient returning Percell Miller call regarding imaging results and referral. Please advise

## 2016-06-06 NOTE — Progress Notes (Signed)
Subjective:    Patient ID: Steven Powell, male    DOB: 1981-03-06, 35 y.o.   MRN: [id number]  HPI  Pt in with some left elbow pain. This has been going on for 3 days. This started on Sunday. Has gradually gotten worse. He remembers doing some breakdancing on Saturday. He may have put some pressure on elbow though he is not sure.  Pt took med ibuprofen and it did help some. He got some better range of motion but without med rom is restricted.  Pt mentions hx of psuedogout.     Review of Systems  Constitutional: Negative for fever, chills and fatigue.  Respiratory: Negative for cough, shortness of breath and wheezing.   Cardiovascular: Negative for chest pain and palpitations.  Musculoskeletal:       Left elbow pain. Distal aspect of tricep mild pain as well.  Neurological: Negative for dizziness and headaches.  Hematological: Negative for adenopathy. Does not bruise/bleed easily.  Psychiatric/Behavioral: Negative for behavioral problems.   Past Medical History  Diagnosis Date  . Asthma     childhood     Social History   Social History  . Marital Status: Single    Spouse Name: N/A  . Number of Children: N/A  . Years of Education: N/A   Occupational History  . Not on file.   Social History Main Topics  . Smoking status: Never Smoker   . Smokeless tobacco: Never Used  . Alcohol Use: Yes  . Drug Use: No  . Sexual Activity: Not on file   Other Topics Concern  . Not on file   Social History Narrative   Occupation: currently working for [employer]   Single   Never Smoked    Alcohol use-yes (social)             Past Surgical History  Procedure Laterality Date  . Knee surgery      Family History  Problem Relation Age of Onset  . Diabetes    . Hyperlipidemia    . Hypertension      No Known Allergies  Current Outpatient Prescriptions on File Prior to Visit  Medication Sig Dispense Refill  . HYDROcodone-homatropine (HYCODAN) 5-1.5 MG/5ML syrup Take 5  mLs by mouth every 8 (eight) hours as needed for cough. (Patient not taking: Reported on 06/06/2016) 120 mL 0  . indomethacin (INDOCIN) 50 MG capsule Take 1 capsule (50 mg total) by mouth 2 (two) times daily with a meal. (Patient not taking: Reported on 06/06/2016) 20 capsule 0   No current facility-administered medications on file prior to visit.    BP 112/82 mmHg  Pulse 103  Temp(Src) 97.8 F (36.6 C) (Oral)  Ht 5\' 4"  (1.626 m)  Wt 183 lb 12.8 oz (83.371 kg)  BMI 31.53 kg/m2  SpO2 97%       Objective:   Physical Exam   General- No acute distress. Pleasant patient. Lungs- Clear, even and unlabored. Heart- regular rate and rhythm. Neurologic- CNII- XII grossly intact.  Lt elbow- faint lateral epicondyle tender. The olecranon bursae appears mild swollen. Can't full extend elbow.  Left upper ext- mild distal aspect of tricep tender to palpation.      Assessment & Plan:  For your elbow pain will get xray today.  For pain and inflammation we gave toradol 60 mg im today.  Start tomorrow diclofenac. If this upsets you stomach start zantac. Stop your ibuprofen-famotidine combination medication.  Apply ice twice daily and could use otc tennis elbow brace.  No upper body exercises for at least 4-5 days. If pain is persisting by early next week then could refer to sports medicine to expedite recovery  Pt declined uric acid testing  Follow up in 7 days or as needed

## 2016-06-06 NOTE — Progress Notes (Signed)
Pre visit review using our clinic tool,if applicable. No additional management support is needed unless otherwise documented below in the visit note.  

## 2016-06-06 NOTE — Telephone Encounter (Signed)
Spoke with pt and he voices understanding.  

## 2016-06-11 ENCOUNTER — Ambulatory Visit: Payer: 59 | Admitting: Family Medicine

## 2016-06-13 ENCOUNTER — Ambulatory Visit (INDEPENDENT_AMBULATORY_CARE_PROVIDER_SITE_OTHER): Payer: 59 | Admitting: Family Medicine

## 2016-06-13 ENCOUNTER — Encounter: Payer: Self-pay | Admitting: Family Medicine

## 2016-06-13 VITALS — BP 113/78 | HR 84 | Ht 64.0 in | Wt 183.0 lb

## 2016-06-13 DIAGNOSIS — S46312A Strain of muscle, fascia and tendon of triceps, left arm, initial encounter: Secondary | ICD-10-CM | POA: Diagnosis not present

## 2016-06-13 NOTE — Patient Instructions (Signed)
You suffered a mild strain of your triceps. Your ultrasound is reassuring - you have a 32mm calcification at the insertion but this is from an old injury. No acute tearing seen. Start with light weight triceps exercises 3 sets of 10 of triceps overhead, pushdown, and/or kickbacks. Increase by 5-10 pound increments every day as long as pain stays at or less than a 3 on a scale of 1-10. Icing as needed. Follow up as needed also.

## 2016-06-14 DIAGNOSIS — S46312A Strain of muscle, fascia and tendon of triceps, left arm, initial encounter: Secondary | ICD-10-CM | POA: Insufficient documentation

## 2016-06-14 NOTE — Progress Notes (Signed)
PCP: Penni Homans, MD  Consultation requested by: Mackie Pai [facility]  Subjective:   HPI: Patient is a 35 y.o. male here for Left arm pain.  Patient reports about 1 1/2 weeks ago when working out he noticed soreness in posterior left elbow. This worsened when he was breakdancing after this. Pain has improved since then, now at 0/10 - still just very mild soreness with certain activities, extending elbow. No prior issues. No swelling or bruising now - right afterwards had some mild swelling. No skin changes, numbness.  Past Medical History  Diagnosis Date  . Asthma     childhood    Current Outpatient Prescriptions on File Prior to Visit  Medication Sig Dispense Refill  . diclofenac (VOLTAREN) 75 MG EC tablet Take 1 tablet (75 mg total) by mouth 2 (two) times daily. 30 tablet 0  . HYDROcodone-homatropine (HYCODAN) 5-1.5 MG/5ML syrup Take 5 mLs by mouth every 8 (eight) hours as needed for cough. (Patient not taking: Reported on 06/06/2016) 120 mL 0  . ranitidine (ZANTAC) 150 MG capsule Take 1 capsule (150 mg total) by mouth 2 (two) times daily. 60 capsule 0   No current facility-administered medications on file prior to visit.    Past Surgical History  Procedure Laterality Date  . Knee surgery      No Known Allergies  Social History   Social History  . Marital Status: Single    Spouse Name: N/A  . Number of Children: N/A  . Years of Education: N/A   Occupational History  . Not on file.   Social History Main Topics  . Smoking status: Never Smoker   . Smokeless tobacco: Never Used  . Alcohol Use: 0.0 oz/week    0 Standard drinks or equivalent per week  . Drug Use: No  . Sexual Activity: Not on file   Other Topics Concern  . Not on file   Social History Narrative   Occupation: currently working for [employer]   Single   Never Smoked    Alcohol use-yes (social)             Family History  Problem Relation Age of Onset  . Diabetes    . Hyperlipidemia    .  Hypertension      BP 113/78 mmHg  Pulse 84  Ht 5\' 4"  (1.626 m)  Wt 183 lb (83.008 kg)  BMI 31.40 kg/m2  Review of Systems: See HPI above.    Objective:  Physical Exam:  Gen: NAD, comfortable in exam room  Left elbow: No gross deformity, swelling, bruising. No tenderness currently - reports pain was at insertion of triceps on olecranon.  No other tenderness. FROM with minimal pain on elbow extension.   No pain with resisted wrist flexion/extension, supination, pronation. Collateral ligaments intact. NVI distally.  MSK u/s left elbow:  Calcification within triceps at insertion.  No anechoic or hypoechoic areas consistent with a tear.  No increased neovascularity.  No other abnormalities.    Assessment & Plan:  1. Left triceps strain - Ultrasound reassuring though shows remote issue with triceps with calcification at insertion.  Reassured patient.  Shown home exercises to do daily.  Icing, tylenol/nsaids if needed.  F/u prn.

## 2016-06-14 NOTE — Assessment & Plan Note (Signed)
Ultrasound reassuring though shows remote issue with triceps with calcification at insertion.  Reassured patient.  Shown home exercises to do daily.  Icing, tylenol/nsaids if needed.  F/u prn.

## 2016-08-07 ENCOUNTER — Encounter: Payer: Self-pay | Admitting: Physician Assistant

## 2016-08-07 ENCOUNTER — Ambulatory Visit (INDEPENDENT_AMBULATORY_CARE_PROVIDER_SITE_OTHER): Payer: 59 | Admitting: Physician Assistant

## 2016-08-07 VITALS — BP 116/84 | HR 102 | Temp 98.2°F | Resp 16 | Ht 64.0 in | Wt 181.1 lb

## 2016-08-07 DIAGNOSIS — S8991XA Unspecified injury of right lower leg, initial encounter: Secondary | ICD-10-CM | POA: Diagnosis not present

## 2016-08-07 NOTE — Progress Notes (Signed)
   Patient presents to clinic today c/o R knee pain x 5 days after bumping into a machine at the gym. Endorses swelling and pressure at onset that is improving. Endorses pain mostly lateral knee and thigh that is improving. Has taken Ibuprofen with some relief in symptoms. Has refrained from working out since onset of pain.   Past Medical History:  Diagnosis Date  . Asthma    childhood    Current Outpatient Prescriptions on File Prior to Visit  Medication Sig Dispense Refill  . diclofenac (VOLTAREN) 75 MG EC tablet Take 1 tablet (75 mg total) by mouth 2 (two) times daily. 30 tablet 0  . ranitidine (ZANTAC) 150 MG capsule Take 1 capsule (150 mg total) by mouth 2 (two) times daily. 60 capsule 0   No current facility-administered medications on file prior to visit.     No Known Allergies  Family History  Problem Relation Age of Onset  . Diabetes    . Hyperlipidemia    . Hypertension      Social History   Social History  . Marital status: Single    Spouse name: N/A  . Number of children: N/A  . Years of education: N/A   Social History Main Topics  . Smoking status: Never Smoker  . Smokeless tobacco: Never Used  . Alcohol use 0.0 oz/week  . Drug use: No  . Sexual activity: Not Asked   Other Topics Concern  . None   Social History Narrative   Occupation: currently working for [employer]   Single   Never Smoked    Alcohol use-yes (social)            Review of Systems - See HPI.  All other ROS are negative.  BP 116/84 (BP Location: Right Arm, Patient Position: Sitting, Cuff Size: Normal)   Pulse (!) 102   Temp 98.2 F (36.8 C) (Oral)   Resp 16   Ht 5\' 4"  (1.626 m)   Wt 181 lb 2 oz (82.2 kg)   SpO2 98%   BMI 31.09 kg/m   Physical Exam  Constitutional: He is oriented to person, place, and time and well-developed, well-nourished, and in no distress.  HENT:  Head: Normocephalic and atraumatic.  Eyes: Conjunctivae are normal.  Neck: Neck supple.  Cardiovascular:  Normal rate, regular rhythm, normal heart sounds and intact distal pulses.   Pulmonary/Chest: Effort normal and breath sounds normal. No respiratory distress. He has no wheezes. He has no rales. He exhibits no tenderness.  Musculoskeletal:       Right knee: Tenderness found. Patellar tendon tenderness noted.  Swelling of patellar bursa noted with mild tenderness.  Neurological: He is alert and oriented to person, place, and time.  Skin: Skin is warm and dry. No rash noted.  Psychiatric: Affect normal.  Vitals reviewed.  Assessment/Plan: 1. Right knee injury, initial encounter Lateral muscular tenderness and evidence of prepatellar bursitis noted.  RICE. Knee brace. Continue Ibuprofen. Symptoms already improved tremendously. Will follow over the next few days.   Leeanne Rio, PA-C

## 2016-08-07 NOTE — Patient Instructions (Signed)
Please wear your knee brace. Ice the knee and elevate while resting. No gym for the next week. Limit kneeling as this can worsen this condition. Continue the Ibuprofen. Swelling should continue to resolve over the next few days. If not improving, we may need imaging.

## 2016-11-14 ENCOUNTER — Ambulatory Visit (INDEPENDENT_AMBULATORY_CARE_PROVIDER_SITE_OTHER): Payer: 59 | Admitting: Family Medicine

## 2016-11-14 ENCOUNTER — Encounter: Payer: Self-pay | Admitting: Family Medicine

## 2016-11-14 VITALS — BP 122/90 | HR 100 | Temp 98.1°F | Ht 64.0 in | Wt 192.6 lb

## 2016-11-14 DIAGNOSIS — B078 Other viral warts: Secondary | ICD-10-CM

## 2016-11-14 DIAGNOSIS — E663 Overweight: Secondary | ICD-10-CM | POA: Diagnosis not present

## 2016-11-14 NOTE — Progress Notes (Signed)
[facility] [address], [place], [place] [address] [phone] [id number] [phone]  Date:  11/14/2016   Name:  Steven Powell   DOB:  1981-01-16   MRN:  [id number]  PCP:  Penni Homans, MD    Chief Complaint: No chief complaint on file.   History of Present Illness:  Steven Powell is a 35 y.o. very pleasant male patient who presents with the following:  He has noted a bump on his right arm which has been present for years.  He started using an OTC acid based wart treatment on the area about 3 weeks ago which seemed to make it larger and more inflamed- he was concerned so he came in to be seen.    He is otherwise feeling well.   He recently got back from his honeymoon and ate a lot on the trip- he notes that he gained about 10 lbs and plans to work on this.  He has used various diets and intermittent fasting in the past which worked- he plans to go back on this plan   His lowest weight was 177-  Wt Readings from Last 3 Encounters:  11/14/16 192 lb 9.6 oz (87.4 kg)  08/07/16 181 lb 2 oz (82.2 kg)  06/13/16 183 lb (83 kg)   No other warts or skin concerns No fever, chills, nausea, vomiting, rash or other symptoms at this time.  Noted elevated pulse and BP- pt admits that he was upset when he saw his weight and suspect this is why he was elevated    Patient Active Problem List   Diagnosis Date Noted  . Strain of left triceps 06/14/2016  . Knee injury 08/30/2014  . URI (upper respiratory infection) 02/18/2013  . Family history of diabetes mellitus 05/03/2011  . General medical examination 05/03/2011  . Reactive airway disease 04/19/2011    Past Medical History:  Diagnosis Date  . Asthma    childhood    Past Surgical History:  Procedure Laterality Date  . KNEE SURGERY      Social History  Substance Use Topics  . Smoking status: Never Smoker  . Smokeless tobacco: Never Used  . Alcohol use 0.0 oz/week    Family History   Problem Relation Age of Onset  . Diabetes    . Hyperlipidemia    . Hypertension      No Known Allergies  Medication list has been reviewed and updated.  Current Outpatient Prescriptions on File Prior to Visit  Medication Sig Dispense Refill  . diclofenac (VOLTAREN) 75 MG EC tablet Take 1 tablet (75 mg total) by mouth 2 (two) times daily. 30 tablet 0  . ibuprofen (ADVIL,MOTRIN) 200 MG tablet Take 200 mg by mouth every 6 (six) hours as needed.    . ranitidine (ZANTAC) 150 MG capsule Take 1 capsule (150 mg total) by mouth 2 (two) times daily. 60 capsule 0   No current facility-administered medications on file prior to visit.     Review of Systems:  As per HPI- otherwise negative.   Physical Examination: Blood pressure 118/87, pulse (!) 111, temperature 98.1 F (36.7 C), temperature source Oral, height 5\' 4"  (1.626 m), weight 192 lb 9.6 oz (87.4 kg), SpO2 100 %. Ideal Body Weight:    GEN: WDWN, NAD, Non-toxic, A & O x 3, overweight, otherwise looks well HEENT: Atraumatic, Normocephalic. Neck supple. No masses, No LAD. Ears and Nose: No external deformity. CV: RRR, No M/G/R. No JVD.  No thrill. No extra heart sounds. PULM: CTA B, no wheezes, crackles, rhonchi. No retractions. No resp. distress. No accessory muscle use. EXTR: No c/c/e NEURO Normal gait.  PSYCH: Normally interactive. Conversant. Not depressed or anxious appearing.  Calm demeanor.  Wart on extensor surface of right forearm - it is approx the size and shape of a pencil eraser  VC obtained. Liquid nitrogen to wart x 3 freeze thaw cycles  Assessment and Plan: Common wart  Overweight here today with concern about a wart on his right arm.  Cryotherapy x3- he will see me in about one month for a repeat treatment if needed.   He plans to work on his weight and get back on track with his eating.  He will see [place] for a CPE in the next couple of months    Signed Lamar Blinks, MD

## 2016-11-14 NOTE — Patient Instructions (Signed)
We froze your wart today- if this does not get it to go away we can freeze it again in about one month Good luck with getting your diet back on track and congratulations on your marriage!

## 2017-01-22 ENCOUNTER — Telehealth: Payer: Self-pay | Admitting: Family Medicine

## 2017-01-22 NOTE — Telephone Encounter (Signed)
Dr. Charlett Blake has not seen this patient so not sure what they message is referrring to.

## 2017-01-22 NOTE — Telephone Encounter (Signed)
Dr. Charlett Blake is marked as patient's PCP but I see that he has seen Percell Miller, Dr. Lorelei Pont, and [place]. I put the patient through to medical records when I spoke with him but he stated that they didn't have any information about this either.Marland KitchenMarland Kitchen

## 2017-01-22 NOTE — Telephone Encounter (Signed)
Patient is calling stating that his insurance company sent over a request for records. He is checking the status of these records. Please advise  Phone: [phone]

## 2017-07-08 ENCOUNTER — Encounter: Payer: 59 | Admitting: Family Medicine

## 2017-07-25 ENCOUNTER — Encounter: Payer: 59 | Admitting: Family Medicine

## 2017-10-01 ENCOUNTER — Encounter: Payer: Self-pay | Admitting: Family Medicine

## 2017-10-01 ENCOUNTER — Ambulatory Visit (INDEPENDENT_AMBULATORY_CARE_PROVIDER_SITE_OTHER): Payer: 59 | Admitting: Family Medicine

## 2017-10-01 VITALS — BP 120/72 | HR 118 | Temp 97.8°F | Resp 18 | Ht 64.0 in | Wt 194.4 lb

## 2017-10-01 DIAGNOSIS — E785 Hyperlipidemia, unspecified: Secondary | ICD-10-CM | POA: Diagnosis not present

## 2017-10-01 DIAGNOSIS — E1169 Type 2 diabetes mellitus with other specified complication: Secondary | ICD-10-CM | POA: Diagnosis not present

## 2017-10-01 DIAGNOSIS — G4733 Obstructive sleep apnea (adult) (pediatric): Secondary | ICD-10-CM | POA: Insufficient documentation

## 2017-10-01 DIAGNOSIS — M10069 Idiopathic gout, unspecified knee: Secondary | ICD-10-CM | POA: Diagnosis not present

## 2017-10-01 DIAGNOSIS — R7303 Prediabetes: Secondary | ICD-10-CM | POA: Insufficient documentation

## 2017-10-01 DIAGNOSIS — M1120 Other chondrocalcinosis, unspecified site: Secondary | ICD-10-CM

## 2017-10-01 DIAGNOSIS — E782 Mixed hyperlipidemia: Secondary | ICD-10-CM

## 2017-10-01 DIAGNOSIS — R0681 Apnea, not elsewhere classified: Secondary | ICD-10-CM | POA: Diagnosis not present

## 2017-10-01 DIAGNOSIS — R739 Hyperglycemia, unspecified: Secondary | ICD-10-CM

## 2017-10-01 DIAGNOSIS — E119 Type 2 diabetes mellitus without complications: Secondary | ICD-10-CM

## 2017-10-01 DIAGNOSIS — Z0000 Encounter for general adult medical examination without abnormal findings: Secondary | ICD-10-CM

## 2017-10-01 DIAGNOSIS — M109 Gout, unspecified: Secondary | ICD-10-CM | POA: Insufficient documentation

## 2017-10-01 DIAGNOSIS — M25562 Pain in left knee: Secondary | ICD-10-CM | POA: Insufficient documentation

## 2017-10-01 DIAGNOSIS — T7840XA Allergy, unspecified, initial encounter: Secondary | ICD-10-CM | POA: Diagnosis not present

## 2017-10-01 HISTORY — DX: Type 2 diabetes mellitus without complications: E11.9

## 2017-10-01 MED ORDER — IBUPROFEN 200 MG PO TABS: 200.0000 mg | ORAL_TABLET | Freq: Four times a day (QID) | ORAL | 0 refills | Status: DC | PRN

## 2017-10-01 NOTE — Patient Instructions (Signed)
For knee lidocaine gel for knee pain, menthol is also helpful Preventive Care 18-39 Years, Male Preventive care refers to lifestyle choices and visits with your health care provider that can promote health and wellness. What does preventive care include?  A yearly physical exam. This is also called an annual well check.  Dental exams once or twice a year.  Routine eye exams. Ask your health care provider how often you should have your eyes checked.  Personal lifestyle choices, including: ? Daily care of your teeth and gums. ? Regular physical activity. ? Eating a healthy diet. ? Avoiding tobacco and drug use. ? Limiting alcohol use. ? Practicing safe sex. What happens during an annual well check? The services and screenings done by your health care provider during your annual well check will depend on your age, overall health, lifestyle risk factors, and family history of disease. Counseling Your health care provider may ask you questions about your:  Alcohol use.  Tobacco use.  Drug use.  Emotional well-being.  Home and relationship well-being.  Sexual activity.  Eating habits.  Work and work Statistician.  Screening You may have the following tests or measurements:  Height, weight, and BMI.  Blood pressure.  Lipid and cholesterol levels. These may be checked every 5 years starting at age 20.  Diabetes screening. This is done by checking your blood sugar (glucose) after you have not eaten for a while (fasting).  Skin check.  Hepatitis C blood test.  Hepatitis B blood test.  Sexually transmitted disease (STD) testing.  Discuss your test results, treatment options, and if necessary, the need for more tests with your health care provider. Vaccines Your health care provider may recommend certain vaccines, such as:  Influenza vaccine. This is recommended every year.  Tetanus, diphtheria, and acellular pertussis (Tdap, Td) vaccine. You may need a Td booster  every 10 years.  Varicella vaccine. You may need this if you have not been vaccinated.  HPV vaccine. If you are 21 or younger, you may need three doses over 6 months.  Measles, mumps, and rubella (MMR) vaccine. You may need at least one dose of MMR.You may also need a second dose.  Pneumococcal 13-valent conjugate (PCV13) vaccine. You may need this if you have certain conditions and have not been vaccinated.  Pneumococcal polysaccharide (PPSV23) vaccine. You may need one or two doses if you smoke cigarettes or if you have certain conditions.  Meningococcal vaccine. One dose is recommended if you are age 64-21 years and a first-year college student living in a residence hall, or if you have one of several medical conditions. You may also need additional booster doses.  Hepatitis A vaccine. You may need this if you have certain conditions or if you travel or work in places where you may be exposed to hepatitis A.  Hepatitis B vaccine. You may need this if you have certain conditions or if you travel or work in places where you may be exposed to hepatitis B.  Haemophilus influenzae type b (Hib) vaccine. You may need this if you have certain risk factors.  Talk to your health care provider about which screenings and vaccines you need and how often you need them. This information is not intended to replace advice given to you by your health care provider. Make sure you discuss any questions you have with your health care provider. Document Released: 02/12/2002 Document Revised: 09/05/2016 Document Reviewed: 10/18/2015 Elsevier Interactive Patient Education  2017 [employer].

## 2017-10-01 NOTE — Progress Notes (Signed)
Subjective:  I acted as a Education administrator for Dr. Charlett Blake. [place], [place]  Patient ID: Steven Powell, male    DOB: 1981/09/10, 36 y.o.   MRN: [id number]  No chief complaint on file.   HPI  Patient is in today for an annual exam. Patient states he had knee surgery about 4 years ago and currently is having the same knee pain again. He also states his wife suggests he see someone about him sleeping at night, she states he stops breathing in his sleep a few times a night. No recent febrile illness or acute hospitalizations. He is active but not exercising regularly. Denies CP/palp/SOB/HA/congestion/fevers/GI or GU c/o. Taking meds as prescribed. No polyuria or polydipsia.   Patient Care Team: Mosie Lukes, MD as PCP - General (Family Medicine)   Past Medical History:  Diagnosis Date  . Allergy    seasonal mostly in spring  . Asthma    childhood  . Diabetes mellitus type 2, controlled ([place]) 10/01/2017  . Gout    left knee  . Hyperlipidemia   . Hyperlipidemia associated with type 2 diabetes mellitus ([place]) 10/06/2017  . Preventative health care 10/06/2017  . Pseudogout    left knee    Past Surgical History:  Procedure Laterality Date  . KNEE SURGERY    . KNEE SURGERY  2014   arthroscopy left knee, found pseudogout, and meniscal tea    Family History  Problem Relation Age of Onset  . Diabetes Unknown   . Hyperlipidemia Unknown   . Hypertension Unknown   . Hypertension Mother   . Diabetes Father   . Heart disease Father   . Asthma Daughter   . Stroke Maternal Grandmother   . Stroke Maternal Grandfather     Social History   Social History  . Marital status: Single    Spouse name: N/A  . Number of children: N/A  . Years of education: N/A   Occupational History  . Not on file.   Social History Main Topics  . Smoking status: Never Smoker  . Smokeless tobacco: Never Used  . Alcohol use 0.0 oz/week  . Drug use: No  . Sexual activity: Not on file   Other Topics Concern  .  Not on file   Social History Narrative   Occupation: currently working for [employer]   Lives with wife and mom   Never Smoked    Alcohol use-yes (social)   No dietary restrictions, intermittent fasting              Outpatient Medications Prior to Visit  Medication Sig Dispense Refill  . ibuprofen (ADVIL,MOTRIN) 200 MG tablet Take 200 mg by mouth every 6 (six) hours as needed.    . diclofenac (VOLTAREN) 75 MG EC tablet Take 1 tablet (75 mg total) by mouth 2 (two) times daily. 30 tablet 0  . ranitidine (ZANTAC) 150 MG capsule Take 1 capsule (150 mg total) by mouth 2 (two) times daily. 60 capsule 0   No facility-administered medications prior to visit.     No Known Allergies  Review of Systems  Constitutional: Negative for fever and malaise/fatigue.  HENT: Negative for congestion.   Eyes: Negative for blurred vision.  Respiratory: Negative for cough and shortness of breath.   Cardiovascular: Negative for chest pain, palpitations and leg swelling.  Gastrointestinal: Negative for vomiting.  Musculoskeletal: Positive for joint pain. Negative for back pain.  Skin: Negative for rash.  Neurological: Negative for loss of consciousness and headaches.  Objective:    Physical Exam  Constitutional: He is oriented to person, place, and time. He appears well-developed and well-nourished. No distress.  HENT:  Head: Normocephalic and atraumatic.  Eyes: Conjunctivae are normal.  Neck: Normal range of motion. No thyromegaly present.  Cardiovascular: Normal rate and regular rhythm.   Pulmonary/Chest: Effort normal and breath sounds normal. He has no wheezes.  Abdominal: Soft. Bowel sounds are normal. There is no tenderness.  Musculoskeletal: Normal range of motion. He exhibits no edema or deformity.  Neurological: He is alert and oriented to person, place, and time.  Skin: Skin is warm and dry. He is not diaphoretic.  Psychiatric: He has a normal mood and affect.    BP 120/72 (BP  Location: Left Arm, Patient Position: Sitting, Cuff Size: Normal)   Pulse (!) 118   Temp 97.8 F (36.6 C) (Oral)   Resp 18   Ht 5\' 4"  (1.626 m)   Wt 194 lb 6.4 oz (88.2 kg)   SpO2 98%   BMI 33.37 kg/m  Wt Readings from Last 3 Encounters:  10/01/17 194 lb 6.4 oz (88.2 kg)  11/14/16 192 lb 9.6 oz (87.4 kg)  08/07/16 181 lb 2 oz (82.2 kg)   BP Readings from Last 3 Encounters:  10/01/17 120/72  11/14/16 122/90  08/07/16 116/84      There is no immunization history on file for this patient.  Health Maintenance  Topic Date Due  . PNEUMOCOCCAL POLYSACCHARIDE VACCINE (1) 04/17/1985  . FOOT EXAM  04/17/1993  . OPHTHALMOLOGY EXAM  04/17/1993  . URINE MICROALBUMIN  04/17/1993  . HIV Screening  04/17/1998  . TETANUS/TDAP  11/14/2017 (Originally 04/17/2002)  . HEMOGLOBIN A1C  04/01/2018  . INFLUENZA VACCINE  Excluded    Lab Results  Component Value Date   WBC 9.1 10/01/2017   HGB 15.9 10/01/2017   HCT 48.3 10/01/2017   PLT 411.0 (H) 10/01/2017   GLUCOSE 105 (H) 10/01/2017   CHOL 240 (H) 10/01/2017   TRIG 141.0 10/01/2017   HDL 39.40 10/01/2017   LDLDIRECT 161.4 03/02/2008   LDLCALC 172 (H) 10/01/2017   ALT 63 (H) 10/01/2017   AST 28 10/01/2017   NA 139 10/01/2017   K 5.0 10/01/2017   CL 103 10/01/2017   CREATININE 1.27 10/01/2017   BUN 11 10/01/2017   CO2 27 10/01/2017   TSH 1.53 10/01/2017   HGBA1C 6.7 (H) 10/01/2017    Lab Results  Component Value Date   TSH 1.53 10/01/2017   Lab Results  Component Value Date   WBC 9.1 10/01/2017   HGB 15.9 10/01/2017   HCT 48.3 10/01/2017   MCV 91.7 10/01/2017   PLT 411.0 (H) 10/01/2017   Lab Results  Component Value Date   NA 139 10/01/2017   K 5.0 10/01/2017   CO2 27 10/01/2017   GLUCOSE 105 (H) 10/01/2017   BUN 11 10/01/2017   CREATININE 1.27 10/01/2017   BILITOT 1.2 10/01/2017   ALKPHOS 72 10/01/2017   AST 28 10/01/2017   ALT 63 (H) 10/01/2017   PROT 8.3 10/01/2017   ALBUMIN 4.5 10/01/2017   CALCIUM 9.9  10/01/2017   GFR 68.81 10/01/2017   Lab Results  Component Value Date   CHOL 240 (H) 10/01/2017   Lab Results  Component Value Date   HDL 39.40 10/01/2017   Lab Results  Component Value Date   LDLCALC 172 (H) 10/01/2017   Lab Results  Component Value Date   TRIG 141.0 10/01/2017   Lab Results  Component Value Date   CHOLHDL 6 10/01/2017   Lab Results  Component Value Date   HGBA1C 6.7 (H) 10/01/2017         Assessment & Plan:   Problem List Items Addressed This Visit    Allergy   Relevant Orders   Comprehensive metabolic panel (Completed)   Gout    Has previously been told her has pseudogout after an aspiration but he has a strong family history of gout and his uric acid is very high. Needs to maintain adequate hydration and started on Allopurinol and Colchicine and reassess.       Left knee pain   Relevant Orders   Uric acid (Completed)   Diabetes mellitus type 2, controlled (HCC)    hgba1c acceptable, minimize simple carbs. Increase exercise as tolerated. Continue current meds      Witnessed episode of apnea - Primary    His wife has witnessed episodes and he acknowledges snoring and restless sleep as well as excessive fatigue. He is referred to pulmonology for likely sleep study.       Relevant Orders   Ambulatory referral to Pulmonology   Hyperlipidemia associated with type 2 diabetes mellitus ([place])    He needs to start Atorvastatin due to his elevated sugars also. 10 mg po qhs.       Preventative health care    Patient encouraged to maintain heart healthy diet, regular exercise, adequate sleep. Consider daily probiotics. Take medications as prescribed. Labs reviewed      Relevant Orders   CBC (Completed)   TSH (Completed)    Other Visit Diagnoses    Mixed hyperlipidemia       Relevant Orders   Lipid panel (Completed)      I have discontinued Mr. Merlino's diclofenac, ranitidine, and ibuprofen. I am also having him start on  ibuprofen.  Meds ordered this encounter  Medications  . ibuprofen (ADVIL,MOTRIN) 200 MG tablet    Sig: Take 1-2 tablets (200-400 mg total) by mouth every 6 (six) hours as needed.    Dispense:  30 tablet    Refill:  0    CMA served as scribe during this visit. History, Physical and Plan performed by medical provider. Documentation and orders reviewed and attested to.  Penni Homans, MD

## 2017-10-02 ENCOUNTER — Encounter: Payer: Self-pay | Admitting: Family Medicine

## 2017-10-02 ENCOUNTER — Telehealth: Payer: Self-pay

## 2017-10-02 LAB — COMPREHENSIVE METABOLIC PANEL
ALBUMIN: 4.5 g/dL (ref 3.5–5.2)
ALT: 63 U/L — ABNORMAL HIGH (ref 0–53)
AST: 28 U/L (ref 0–37)
Alkaline Phosphatase: 72 U/L (ref 39–117)
BUN: 11 mg/dL (ref 6–23)
CALCIUM: 9.9 mg/dL (ref 8.4–10.5)
CHLORIDE: 103 meq/L (ref 96–112)
CO2: 27 mEq/L (ref 19–32)
Creatinine, Ser: 1.27 mg/dL (ref 0.40–1.50)
GFR: 68.81 mL/min (ref >60.00)
Glucose, Bld: 105 mg/dL — ABNORMAL HIGH (ref 70–99)
POTASSIUM: 5 meq/L (ref 3.5–5.1)
SODIUM: 139 meq/L (ref 135–145)
Total Bilirubin: 1.2 mg/dL (ref 0.2–1.2)
Total Protein: 8.3 g/dL (ref 6.0–8.3)

## 2017-10-02 LAB — LIPID PANEL
CHOLESTEROL: 240 mg/dL — AB (ref 0–200)
HDL: 39.4 mg/dL (ref >39.00)
LDL CALC: 172 mg/dL — AB (ref 0–99)
NonHDL: 200.26
TRIGLYCERIDES: 141 mg/dL (ref 0.0–149.0)
Total CHOL/HDL Ratio: 6
VLDL: 28.2 mg/dL (ref 0.0–40.0)

## 2017-10-02 LAB — CBC
HEMATOCRIT: 48.3 % (ref 39.0–52.0)
Hemoglobin: 15.9 g/dL (ref 13.0–17.0)
MCHC: 33 g/dL (ref 30.0–36.0)
MCV: 91.7 fl (ref 78.0–100.0)
Platelets: 411 10*3/uL — ABNORMAL HIGH (ref 150.0–400.0)
RBC: 5.27 Mil/uL (ref 4.22–5.81)
RDW: 12.8 % (ref 11.5–15.5)
WBC: 9.1 10*3/uL (ref 4.0–10.5)

## 2017-10-02 LAB — HEMOGLOBIN A1C: Hgb A1c MFr Bld: 6.7 % — ABNORMAL HIGH (ref 4.6–6.5)

## 2017-10-02 LAB — URIC ACID: Uric Acid, Serum: 12.1 mg/dL — ABNORMAL HIGH (ref 4.0–7.8)

## 2017-10-02 LAB — TSH: TSH: 1.53 u[IU]/mL (ref 0.35–4.50)

## 2017-10-02 MED ORDER — ALLOPURINOL 100 MG PO TABS: 100.0000 mg | ORAL_TABLET | Freq: Every day | ORAL | 3 refills | Status: DC

## 2017-10-02 MED ORDER — COLCHICINE 0.6 MG PO TABS: 0.6000 mg | ORAL_TABLET | Freq: Every day | ORAL | 3 refills | Status: DC

## 2017-10-02 NOTE — Telephone Encounter (Signed)
Pt aware of labs/declines glucometer and supplies states that he started a diet last week and has not been able to work out at the gym for 3 months/will check blood work in 3 months and visit/faxed allopurinol and colchicine to [facility] on [address] per his req/thx dmf

## 2017-10-02 NOTE — Telephone Encounter (Signed)
-----   Message from Mosie Lukes, MD sent at 10/02/2017  2:16 PM EDT ----- Notify sugar is high with a hgba1c of 6.7. Anything above 6.5 is considered diabetes. This number does not require meds but it is important to minimize carbohydrates and increase exercise. I would be happy to refer to a nutritionist for counseling or bring him back in here to discuss further. He would also benefit from a glucometer to check sugars, disp #1 with sig check sugars fasting daily and as needed disp lancets and test strips disp #100 of each with 5 rf. Then he can come in for a nurse visit to learn how to use the glucometer. Also his uric acid is very high so he has gout. He needs to start Allopurinol 100 mg tabs, 1 tab daily disp #30 with 3 rf to bring dit down and for one month. Cohchicine 0.6 mg tabs, 1 tab po daily disp #30. Then in 3 months repeat labs and return for appt.

## 2017-10-06 ENCOUNTER — Encounter: Payer: Self-pay | Admitting: Family Medicine

## 2017-10-06 DIAGNOSIS — E1169 Type 2 diabetes mellitus with other specified complication: Secondary | ICD-10-CM

## 2017-10-06 DIAGNOSIS — E785 Hyperlipidemia, unspecified: Secondary | ICD-10-CM | POA: Insufficient documentation

## 2017-10-06 DIAGNOSIS — Z0000 Encounter for general adult medical examination without abnormal findings: Secondary | ICD-10-CM | POA: Insufficient documentation

## 2017-10-06 HISTORY — DX: Type 2 diabetes mellitus with other specified complication: E11.69

## 2017-10-06 HISTORY — DX: Encounter for general adult medical examination without abnormal findings: Z00.00

## 2017-10-06 NOTE — Assessment & Plan Note (Signed)
He needs to start Atorvastatin due to his elevated sugars also. 10 mg po qhs.

## 2017-10-06 NOTE — Assessment & Plan Note (Signed)
His wife has witnessed episodes and he acknowledges snoring and restless sleep as well as excessive fatigue. He is referred to pulmonology for likely sleep study.

## 2017-10-06 NOTE — Assessment & Plan Note (Signed)
hgba1c acceptable, minimize simple carbs. Increase exercise as tolerated. Continue current meds 

## 2017-10-06 NOTE — Assessment & Plan Note (Signed)
Has previously been told her has pseudogout after an aspiration but he has a strong family history of gout and his uric acid is very high. Needs to maintain adequate hydration and started on Allopurinol and Colchicine and reassess.

## 2017-10-06 NOTE — Assessment & Plan Note (Signed)
Patient encouraged to maintain heart healthy diet, regular exercise, adequate sleep. Consider daily probiotics. Take medications as prescribed. Labs reviewed 

## 2017-10-11 ENCOUNTER — Encounter: Payer: Self-pay | Admitting: Family Medicine

## 2017-12-02 ENCOUNTER — Encounter: Payer: Self-pay | Admitting: Pulmonary Disease

## 2017-12-02 ENCOUNTER — Ambulatory Visit (INDEPENDENT_AMBULATORY_CARE_PROVIDER_SITE_OTHER): Payer: 59 | Admitting: Pulmonary Disease

## 2017-12-02 ENCOUNTER — Other Ambulatory Visit: Payer: Self-pay

## 2017-12-02 DIAGNOSIS — G4721 Circadian rhythm sleep disorder, delayed sleep phase type: Secondary | ICD-10-CM | POA: Diagnosis not present

## 2017-12-02 DIAGNOSIS — G4733 Obstructive sleep apnea (adult) (pediatric): Secondary | ICD-10-CM

## 2017-12-02 MED ORDER — ALLOPURINOL 100 MG PO TABS: 100.0000 mg | ORAL_TABLET | Freq: Every day | ORAL | 3 refills | Status: DC

## 2017-12-02 NOTE — Assessment & Plan Note (Signed)
Given excessive daytime somnolence, narrow pharyngeal exam, witnessed apneas & loud snoring, obstructive sleep apnea is very likely & an overnight polysomnogram will be scheduled as a home study. The pathophysiology of obstructive sleep apnea , it's cardiovascular consequences & modes of treatment including CPAP were discused with the patient in detail & they evidenced understanding.  Pretest probability is high 

## 2017-12-02 NOTE — Patient Instructions (Signed)
Home sleep study  You have delayed sleep phase  - can try to shift by an hour every 2 weeks Light exposure  X 30 mins on waking up Melatonin 3mg  1h prior to planned bedtime

## 2017-12-02 NOTE — Addendum Note (Signed)
Addended by: Della Goo C on: 12/02/2017 02:36 PM   Modules accepted: Orders

## 2017-12-02 NOTE — Assessment & Plan Note (Signed)
-   can try to shift by 1/2- 1 hour every 2 weeks Light exposure  X 30 mins on waking up Melatonin 3mg  1h prior to planned bedtime

## 2017-12-02 NOTE — Progress Notes (Signed)
Subjective:    Patient ID: Steven Powell, male    DOB: 08/16/1981, 36 y.o.   MRN: [id number]  HPI  36 year old 3 man presents for evaluation of sleep disordered breathing. He is newly married and his wife has noted loud snoring, gasping and choking episodes in his sleep.  She has also witnessed apneas he reports occasionally being woken up by his own snoring.  Reports non-refreshing sleep. Epworth sleepiness score is 8. He has always been a night owl since his teenage years.  Bedtime is between 2 and 3 AM, sleep latency is about 50 minutes, sleeps on his right side with one pillow, reports 1-2 nocturnal awakenings and is out of bed around noon feeling tired with occasional dryness of mouth but denies a headache. He has gained about 10-15 pounds over the last 2 years, his maximum weight was around 200 but now he has lost again to about 190 pounds There is no history suggestive of cataplexy, sleep paralysis or parasomnias On his work days during his lunch.  He naps for about 30 minutes.  He works at [employer] between 1 PM to 9:30 PM. 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  He is a prediabetic, reports childhood history of asthma, last ER visit was 2010 and has not needed rescue inhaler much.  He reports seasonal allergies improved with Xyzal    Past Medical History:  Diagnosis Date  . Allergy    seasonal mostly in spring  . Asthma    childhood  . Diabetes mellitus type 2, controlled ([place]) 10/01/2017  . Gout    left knee  . Hyperlipidemia   . Hyperlipidemia associated with type 2 diabetes mellitus ([place]) 10/06/2017  . Preventative health care 10/06/2017  . Pseudogout    left knee     Past Surgical History:  Procedure Laterality Date  . KNEE SURGERY    . KNEE SURGERY  2014   arthroscopy left knee, found pseudogout, and meniscal tea    No Known Allergies  Social History   Socioeconomic History  . Marital status: Single    Spouse  name: Not on file  . Number of children: Not on file  . Years of education: Not on file  . Highest education level: Not on file  Social Needs  . Financial resource strain: Not on file  . Food insecurity - worry: Not on file  . Food insecurity - inability: Not on file  . Transportation needs - medical: Not on file  . Transportation needs - non-medical: Not on file  Occupational History  . Not on file  Tobacco Use  . Smoking status: Never Smoker  . Smokeless tobacco: Never Used  Substance and Sexual Activity  . Alcohol use: Yes    Alcohol/week: 0.0 oz  . Drug use: No  . Sexual activity: Not on file  Other Topics Concern  . Not on file  Social History Narrative   Occupation: currently working for [employer]   Lives with wife and mom   Never Smoked    Alcohol use-yes (social)   No dietary restrictions, intermittent fasting            Review of Systems Constitutional: negative for anorexia, fevers and sweats  Eyes: negative for irritation, redness and visual disturbance  Ears, nose, mouth, throat, and face: negative for earaches, epistaxis, nasal congestion and sore throat  Respiratory: negative for cough, dyspnea on exertion, sputum and wheezing  Cardiovascular:  negative for chest pain, dyspnea, lower extremity edema, orthopnea, palpitations and syncope  Gastrointestinal: negative for abdominal pain, constipation, diarrhea, melena, nausea and vomiting  Genitourinary:negative for dysuria, frequency and hematuria  Hematologic/lymphatic: negative for bleeding, easy bruising and lymphadenopathy  Musculoskeletal:negative for arthralgias, muscle weakness and stiff joints  Neurological: negative for coordination problems, gait problems, headaches and weakness  Endocrine: negative for diabetic symptoms including polydipsia, polyuria and weight loss     Objective:   Physical Exam  Gen. Pleasant, obese, in no distress, normal affect ENT -large tonsils, no post nasal drip, class 2-3  airway Neck: No JVD, no thyromegaly, no carotid bruits Lungs: no use of accessory muscles, no dullness to percussion, decreased without rales or rhonchi  Cardiovascular: Rhythm regular, heart sounds  normal, no murmurs or gallops, no peripheral edema Abdomen: soft and non-tender, no hepatosplenomegaly, BS normal. Musculoskeletal: No deformities, no cyanosis or clubbing Neuro:  alert, non focal, no tremors       Assessment & Plan:

## 2017-12-06 DIAGNOSIS — G4733 Obstructive sleep apnea (adult) (pediatric): Secondary | ICD-10-CM | POA: Diagnosis not present

## 2017-12-07 DIAGNOSIS — G4733 Obstructive sleep apnea (adult) (pediatric): Secondary | ICD-10-CM | POA: Diagnosis not present

## 2017-12-16 ENCOUNTER — Telehealth: Payer: Self-pay | Admitting: Pulmonary Disease

## 2017-12-16 NOTE — Telephone Encounter (Signed)
Per RA, HST showed severe OSA with 77 events per hour. Suggests auto cpap 5-20cm, mask of choice, DL in 4 weeks and OV in 4 weeks with either RA or TP. Also recommends cpap titration.

## 2017-12-18 ENCOUNTER — Other Ambulatory Visit: Payer: Self-pay | Admitting: *Deleted

## 2017-12-18 DIAGNOSIS — G4733 Obstructive sleep apnea (adult) (pediatric): Secondary | ICD-10-CM

## 2017-12-18 NOTE — Telephone Encounter (Signed)
Pt returning phone call regarding results. CB is [phone].

## 2017-12-18 NOTE — Telephone Encounter (Signed)
Left message for patient to call back for results.  

## 2017-12-20 ENCOUNTER — Other Ambulatory Visit: Payer: Self-pay

## 2017-12-20 MED ORDER — COLCHICINE 0.6 MG PO TABS: 0.6000 mg | ORAL_TABLET | Freq: Every day | ORAL | 3 refills | Status: DC

## 2017-12-20 NOTE — Telephone Encounter (Signed)
Patient returning call, CB is [phone]

## 2017-12-26 ENCOUNTER — Telehealth: Payer: Self-pay | Admitting: Pulmonary Disease

## 2017-12-26 DIAGNOSIS — G4733 Obstructive sleep apnea (adult) (pediatric): Secondary | ICD-10-CM

## 2017-12-26 NOTE — Telephone Encounter (Signed)
RA please advise on sleep study results.

## 2018-01-01 ENCOUNTER — Encounter: Payer: Self-pay | Admitting: Pulmonary Disease

## 2018-01-01 NOTE — Telephone Encounter (Signed)
Pt is calling back about results of sleep study.  Cb [phone]

## 2018-01-01 NOTE — Telephone Encounter (Signed)
Please see earlier phone notes from 12/17

## 2018-01-01 NOTE — Telephone Encounter (Signed)
Per RA, HST showed severe OSA with 77 events per hour. Suggests auto cpap 5-20cm, mask of choice, DL in 4 weeks and OV in 4 weeks with either RA or TP. Also recommends cpap titration.  Spoke with patient Advised pt of RA recommendations and results Placed order for cpap machine auto cpap 5-20cm, mask of choice Advised pt to call back to schedule follow up with RA or TP 4 weeks after cpap machine set up with 4 week DL Placed order for cpap titration today Nothing further needed

## 2018-01-01 NOTE — Telephone Encounter (Signed)
   Per RA, HST showed severe OSA with 77 events per hour. Suggests auto cpap 5-20cm, mask of choice, DL in 4 weeks and OV in 4 weeks with either RA or TP. Also recommends cpap titration.     -------------------------------------------- lmtcb x1 for pt.

## 2018-01-02 ENCOUNTER — Ambulatory Visit (INDEPENDENT_AMBULATORY_CARE_PROVIDER_SITE_OTHER): Payer: 59 | Admitting: Family Medicine

## 2018-01-02 ENCOUNTER — Encounter: Payer: Self-pay | Admitting: Family Medicine

## 2018-01-02 VITALS — BP 110/90 | HR 84 | Temp 98.0°F | Resp 16 | Ht 64.0 in | Wt 191.0 lb

## 2018-01-02 DIAGNOSIS — E113531 Type 2 diabetes mellitus with proliferative diabetic retinopathy with traction retinal detachment not involving the macula, right eye: Secondary | ICD-10-CM

## 2018-01-02 DIAGNOSIS — M1000 Idiopathic gout, unspecified site: Secondary | ICD-10-CM | POA: Diagnosis not present

## 2018-01-02 DIAGNOSIS — E785 Hyperlipidemia, unspecified: Secondary | ICD-10-CM | POA: Diagnosis not present

## 2018-01-02 DIAGNOSIS — M25511 Pain in right shoulder: Secondary | ICD-10-CM

## 2018-01-02 DIAGNOSIS — G4733 Obstructive sleep apnea (adult) (pediatric): Secondary | ICD-10-CM | POA: Diagnosis not present

## 2018-01-02 DIAGNOSIS — E1169 Type 2 diabetes mellitus with other specified complication: Secondary | ICD-10-CM | POA: Diagnosis not present

## 2018-01-02 DIAGNOSIS — S46011A Strain of muscle(s) and tendon(s) of the rotator cuff of right shoulder, initial encounter: Secondary | ICD-10-CM | POA: Insufficient documentation

## 2018-01-02 NOTE — Assessment & Plan Note (Signed)
hgba1c acceptable, minimize simple carbs. Increase exercise as tolerated.  

## 2018-01-02 NOTE — Assessment & Plan Note (Signed)
Home sleep study confirmed sleep apnea has a second sleep study scheduled to have CPAP levels titrated.

## 2018-01-02 NOTE — Patient Instructions (Signed)
Avoid nitrates in lunch meats.   Gout Gout is painful swelling that can happen in some of your joints. Gout is a type of arthritis. This condition is caused by having too much uric acid in your body. Uric acid is a chemical that is made when your body breaks down substances called purines. If your body has too much uric acid, sharp crystals can form and build up in your joints. This causes pain and swelling. Gout attacks can happen quickly and be very painful (acute gout). Over time, the attacks can affect more joints and happen more often (chronic gout). Follow these instructions at home: During a Gout Attack  If directed, put ice on the painful area: ? Put ice in a plastic bag. ? Place a towel between your skin and the bag. ? Leave the ice on for 20 minutes, 2-3 times a day.  Rest the joint as much as possible. If the joint is in your leg, you may be given crutches to use.  Raise (elevate) the painful joint above the level of your heart as often as you can.  Drink enough fluids to keep your pee (urine) clear or pale yellow.  Take over-the-counter and prescription medicines only as told by your doctor.  Do not drive or use heavy machinery while taking prescription pain medicine.  Follow instructions from your doctor about what you can or cannot eat and drink.  Return to your normal activities as told by your doctor. Ask your doctor what activities are safe for you. Avoiding Future Gout Attacks  Follow a low-purine diet as told by a specialist (dietitian) or your doctor. Avoid foods and drinks that have a lot of purines, such as: ? Liver. ? Kidney. ? Anchovies. ? Asparagus. ? Herring. ? Mushrooms ? Mussels. ? Beer.  Limit alcohol intake to no more than 1 drink a day for nonpregnant women and 2 drinks a day for men. One drink equals 12 oz of beer, 5 oz of wine, or 1 oz of hard liquor.  Stay at a healthy weight or lose weight if you are overweight. If you want to lose weight,  talk with your doctor. It is important that you do not lose weight too fast.  Start or continue an exercise plan as told by your doctor.  Drink enough fluids to keep your pee clear or pale yellow.  Take over-the-counter and prescription medicines only as told by your doctor.  Keep all follow-up visits as told by your doctor. This is important. Contact a doctor if:  You have another gout attack.  You still have symptoms of a gout attack after10 days of treatment.  You have problems (side effects) because of your medicines.  You have chills or a fever.  You have burning pain when you pee (urinate).  You have pain in your lower back or belly. Get help right away if:  You have very bad pain.  Your pain cannot be controlled.  You cannot pee. This information is not intended to replace advice given to you by your health care provider. Make sure you discuss any questions you have with your health care provider. Document Released: 09/25/2008 Document Revised: 05/24/2016 Document Reviewed: 09/29/2015 Elsevier Interactive Patient Education  [employer].

## 2018-01-02 NOTE — Assessment & Plan Note (Signed)
Encouraged heart healthy diet, increase exercise, avoid trans fats, consider a krill oil cap daily 

## 2018-01-02 NOTE — Assessment & Plan Note (Addendum)
Doing better with allopurinol, knee pain greatly improved

## 2018-01-02 NOTE — Assessment & Plan Note (Signed)
Months of pain anterior and worse with use. Follows with chiropractic, will refer to sports medicine for further consideration

## 2018-01-02 NOTE — Progress Notes (Signed)
Subjective:  I acted as a Education administrator for [employer]. Yancey Flemings, [place]   Patient ID: Steven Powell, male    DOB: 05/17/1981, 37 y.o.   MRN: [id number]  Chief Complaint  Patient presents with  . Follow-up    HPI  Patient is in today for follow up visit he is feeling well today.  He notes with the addition of allopurinol and colchicine his left knee pain and swelling has greatly improved.  He does have a strong family history of gout.  He has been trying to minimize foods that contribute to gout flares as well.  No recent febrile illness or acute hospitalizations.  No polyuria or polydipsia.  Is trying to maintain a heart healthy diet and stay active.  Is complaining of persistent right shoulder pain for many months now.  No falls or trauma.  The pain is bad enough to keep him awake at night at times. Denies CP/palp/SOB/HA/congestion/fevers/GI or GU c/o. Taking meds as prescribed  Patient Care Team: Mosie Lukes, MD as PCP - General (Family Medicine)   Past Medical History:  Diagnosis Date  . Allergy    seasonal mostly in spring  . Asthma    childhood  . Diabetes mellitus type 2, controlled ([place]) 10/01/2017  . Gout    left knee  . Hyperlipidemia   . Hyperlipidemia associated with type 2 diabetes mellitus ([place]) 10/06/2017  . Preventative health care 10/06/2017  . Pseudogout    left knee    Past Surgical History:  Procedure Laterality Date  . KNEE SURGERY    . KNEE SURGERY  2014   arthroscopy left knee, found pseudogout, and meniscal tea    Family History  Problem Relation Age of Onset  . Diabetes Unknown   . Hyperlipidemia Unknown   . Hypertension Unknown   . Hypertension Mother   . Diabetes Father   . Heart disease Father   . Asthma Daughter   . Stroke Maternal Grandmother   . Stroke Maternal Grandfather     Social History   Socioeconomic History  . Marital status: Single    Spouse name: Not on file  . Number of children: Not on file  . Years of education: Not on file   . Highest education level: Not on file  Social Needs  . Financial resource strain: Not on file  . Food insecurity - worry: Not on file  . Food insecurity - inability: Not on file  . Transportation needs - medical: Not on file  . Transportation needs - non-medical: Not on file  Occupational History  . Not on file  Tobacco Use  . Smoking status: Never Smoker  . Smokeless tobacco: Never Used  Substance and Sexual Activity  . Alcohol use: Yes    Alcohol/week: 0.0 oz  . Drug use: No  . Sexual activity: Not on file  Other Topics Concern  . Not on file  Social History Narrative   Occupation: currently working for [employer]   Lives with wife and mom   Never Smoked    Alcohol use-yes (social)   No dietary restrictions, intermittent fasting           Outpatient Medications Prior to Visit  Medication Sig Dispense Refill  . allopurinol (ZYLOPRIM) 100 MG tablet Take 1 tablet (100 mg total) by mouth daily. 30 tablet 3  . colchicine 0.6 MG tablet Take 1 tablet (0.6 mg total) by mouth daily. 30 tablet 3  . ibuprofen (ADVIL,MOTRIN) 200 MG tablet Take 1-2 tablets (200-400  mg total) by mouth every 6 (six) hours as needed. 30 tablet 0   No facility-administered medications prior to visit.     No Known Allergies  Review of Systems  Constitutional: Negative for fever and malaise/fatigue.  HENT: Negative for congestion.   Eyes: Negative for blurred vision.  Respiratory: Negative for shortness of breath.   Cardiovascular: Negative for chest pain, palpitations and leg swelling.  Gastrointestinal: Negative for abdominal pain, blood in stool and nausea.  Genitourinary: Negative for dysuria and frequency.  Musculoskeletal: Positive for joint pain. Negative for falls.  Skin: Negative for rash.  Neurological: Negative for dizziness, loss of consciousness and headaches.  Endo/Heme/Allergies: Negative for environmental allergies.  Psychiatric/Behavioral: Negative for depression. The patient is not  nervous/anxious.        Objective:    Physical Exam  Constitutional: He is oriented to person, place, and time. He appears well-developed and well-nourished. No distress.  HENT:  Head: Normocephalic and atraumatic.  Nose: Nose normal.  Eyes: Right eye exhibits no discharge. Left eye exhibits no discharge.  Neck: Normal range of motion. Neck supple.  Cardiovascular: Normal rate and regular rhythm.  No murmur heard. Pulmonary/Chest: Effort normal and breath sounds normal.  Abdominal: Soft. Bowel sounds are normal. There is no tenderness.  Musculoskeletal: He exhibits no edema.  Neurological: He is alert and oriented to person, place, and time.  Skin: Skin is warm and dry.  Psychiatric: He has a normal mood and affect.  Nursing note and vitals reviewed.   BP 110/90   Pulse 84   Temp 98 F (36.7 C) (Oral)   Resp 16   Ht 5\' 4"  (1.626 m)   Wt 191 lb (86.6 kg)   SpO2 99%   BMI 32.79 kg/m  Wt Readings from Last 3 Encounters:  01/02/18 191 lb (86.6 kg)  12/02/17 191 lb 9.6 oz (86.9 kg)  10/01/17 194 lb 6.4 oz (88.2 kg)   BP Readings from Last 3 Encounters:  01/02/18 110/90  12/02/17 130/70  10/01/17 120/72      There is no immunization history on file for this patient.  Health Maintenance  Topic Date Due  . PNEUMOCOCCAL POLYSACCHARIDE VACCINE (1) 04/17/1985  . FOOT EXAM  04/17/1993  . OPHTHALMOLOGY EXAM  04/17/1993  . URINE MICROALBUMIN  04/17/1993  . HIV Screening  04/17/1998  . TETANUS/TDAP  04/17/2002  . HEMOGLOBIN A1C  04/01/2018  . INFLUENZA VACCINE  Discontinued    Lab Results  Component Value Date   WBC 9.1 10/01/2017   HGB 15.9 10/01/2017   HCT 48.3 10/01/2017   PLT 411.0 (H) 10/01/2017   GLUCOSE 105 (H) 10/01/2017   CHOL 240 (H) 10/01/2017   TRIG 141.0 10/01/2017   HDL 39.40 10/01/2017   LDLDIRECT 161.4 03/02/2008   LDLCALC 172 (H) 10/01/2017   ALT 63 (H) 10/01/2017   AST 28 10/01/2017   NA 139 10/01/2017   K 5.0 10/01/2017   CL 103  10/01/2017   CREATININE 1.27 10/01/2017   BUN 11 10/01/2017   CO2 27 10/01/2017   TSH 1.53 10/01/2017   HGBA1C 6.7 (H) 10/01/2017    Lab Results  Component Value Date   TSH 1.53 10/01/2017   Lab Results  Component Value Date   WBC 9.1 10/01/2017   HGB 15.9 10/01/2017   HCT 48.3 10/01/2017   MCV 91.7 10/01/2017   PLT 411.0 (H) 10/01/2017   Lab Results  Component Value Date   NA 139 10/01/2017   K 5.0 10/01/2017  CO2 27 10/01/2017   GLUCOSE 105 (H) 10/01/2017   BUN 11 10/01/2017   CREATININE 1.27 10/01/2017   BILITOT 1.2 10/01/2017   ALKPHOS 72 10/01/2017   AST 28 10/01/2017   ALT 63 (H) 10/01/2017   PROT 8.3 10/01/2017   ALBUMIN 4.5 10/01/2017   CALCIUM 9.9 10/01/2017   GFR 68.81 10/01/2017   Lab Results  Component Value Date   CHOL 240 (H) 10/01/2017   Lab Results  Component Value Date   HDL 39.40 10/01/2017   Lab Results  Component Value Date   LDLCALC 172 (H) 10/01/2017   Lab Results  Component Value Date   TRIG 141.0 10/01/2017   Lab Results  Component Value Date   CHOLHDL 6 10/01/2017   Lab Results  Component Value Date   HGBA1C 6.7 (H) 10/01/2017         Assessment & Plan:   Problem List Items Addressed This Visit    Gout    Doing better with allopurinol, knee pain greatly improved      Relevant Orders   CBC   TSH   Uric acid   Diabetes mellitus type 2, controlled ([place]) - Primary    hgba1c acceptable, minimize simple carbs. Increase exercise as tolerated.       Relevant Orders   Hemoglobin A1c   CBC   Comprehensive metabolic panel   TSH   OSA (obstructive sleep apnea)    Home sleep study confirmed sleep apnea has a second sleep study scheduled to have CPAP levels titrated.       Hyperlipidemia associated with type 2 diabetes mellitus ([place])    Encouraged heart healthy diet, increase exercise, avoid trans fats, consider a krill oil cap daily      Relevant Orders   CBC   Lipid panel   TSH   Right shoulder pain     Months of pain anterior and worse with use. Follows with chiropractic, will refer to sports medicine for further consideration      Relevant Orders   CBC   Ambulatory referral to Sports Medicine      I am having Steven Powell maintain his ibuprofen, allopurinol, and colchicine.  No orders of the defined types were placed in this encounter.   CMA served as Education administrator during this visit. History, Physical and Plan performed by medical provider. Documentation and orders reviewed and attested to.  Penni Homans, MD

## 2018-01-03 LAB — LIPID PANEL
CHOL/HDL RATIO: 6
Cholesterol: 295 mg/dL — ABNORMAL HIGH (ref 0–200)
HDL: 50.8 mg/dL (ref >39.00)
LDL CALC: 211 mg/dL — AB (ref 0–99)
NonHDL: 244.37
Triglycerides: 168 mg/dL — ABNORMAL HIGH (ref 0.0–149.0)
VLDL: 33.6 mg/dL (ref 0.0–40.0)

## 2018-01-03 LAB — CBC
HCT: 50.7 % (ref 39.0–52.0)
Hemoglobin: 16.7 g/dL (ref 13.0–17.0)
MCHC: 32.9 g/dL (ref 30.0–36.0)
MCV: 92.2 fl (ref 78.0–100.0)
Platelets: 348 10*3/uL (ref 150.0–400.0)
RBC: 5.5 Mil/uL (ref 4.22–5.81)
RDW: 13.6 % (ref 11.5–15.5)
WBC: 7.9 10*3/uL (ref 4.0–10.5)

## 2018-01-03 LAB — COMPREHENSIVE METABOLIC PANEL
ALT: 80 U/L — ABNORMAL HIGH (ref 0–53)
AST: 36 U/L (ref 0–37)
Albumin: 4.9 g/dL (ref 3.5–5.2)
Alkaline Phosphatase: 62 U/L (ref 39–117)
BUN: 12 mg/dL (ref 6–23)
CHLORIDE: 103 meq/L (ref 96–112)
CO2: 28 mEq/L (ref 19–32)
CREATININE: 1.21 mg/dL (ref 0.40–1.50)
Calcium: 10.2 mg/dL (ref 8.4–10.5)
GFR: 72.65 mL/min (ref >60.00)
GLUCOSE: 91 mg/dL (ref 70–99)
Potassium: 4.2 mEq/L (ref 3.5–5.1)
SODIUM: 142 meq/L (ref 135–145)
Total Bilirubin: 2 mg/dL — ABNORMAL HIGH (ref 0.2–1.2)
Total Protein: 8 g/dL (ref 6.0–8.3)

## 2018-01-03 LAB — HEMOGLOBIN A1C: Hgb A1c MFr Bld: 6.4 % (ref 4.6–6.5)

## 2018-01-03 LAB — TSH: TSH: 2.89 u[IU]/mL (ref 0.35–4.50)

## 2018-01-03 LAB — URIC ACID: URIC ACID, SERUM: 9.4 mg/dL — AB (ref 4.0–7.8)

## 2018-01-09 ENCOUNTER — Ambulatory Visit (INDEPENDENT_AMBULATORY_CARE_PROVIDER_SITE_OTHER): Payer: 59 | Admitting: Family Medicine

## 2018-01-09 DIAGNOSIS — M25511 Pain in right shoulder: Secondary | ICD-10-CM

## 2018-01-09 NOTE — Patient Instructions (Signed)
Your exam is reassuring. This should respond to strengthening of the proximal biceps tendon and rotator cuff. Bicep curls, hammer rotations 3 sets of 10 once a day. Do theraband strengthening exercises 3 sets of 10 once a day too for your rotator cuff. Tylenol, ibuprofen only if needed. Activities as tolerated - I'd only avoid exercises if they cause you pain above a 3 on a scale of 1-10. Follow up with me in 5 weeks if you're not improving as expected.

## 2018-01-10 ENCOUNTER — Other Ambulatory Visit: Payer: Self-pay

## 2018-01-10 ENCOUNTER — Telehealth: Payer: Self-pay | Admitting: Pulmonary Disease

## 2018-01-10 DIAGNOSIS — G4733 Obstructive sleep apnea (adult) (pediatric): Secondary | ICD-10-CM

## 2018-01-10 MED ORDER — ATORVASTATIN CALCIUM 10 MG PO TABS: 10.0000 mg | ORAL_TABLET | Freq: Every day | ORAL | 3 refills | Status: DC

## 2018-01-10 MED ORDER — ALLOPURINOL 100 MG PO TABS: 200.0000 mg | ORAL_TABLET | Freq: Every day | ORAL | 3 refills | Status: DC

## 2018-01-10 NOTE — Progress Notes (Signed)
Called pt Rx sent

## 2018-01-10 NOTE — Telephone Encounter (Signed)
Rx for  -AutoCPAP 5-15 cm, mask of choice, humidity, DL & OV in 4 wks

## 2018-01-10 NOTE — Telephone Encounter (Signed)
-----   Message from Joellen Jersey sent at 01/08/2018  4:16 PM EST ----- [facility] denied cpap titration study pt needs to try auto cpap 1st

## 2018-01-11 ENCOUNTER — Encounter: Payer: Self-pay | Admitting: Family Medicine

## 2018-01-11 NOTE — Assessment & Plan Note (Signed)
Exam is reassuring.  Consistent with mild proximal biceps tendinopathy and rotator cuff impingement.  Reviewed home exercises to do daily.  Tylenol, ibuprofen if needed.  Consider PT if not improving as expected.  F/u in 5 weeks if not improving.

## 2018-01-11 NOTE — Progress Notes (Signed)
PCP and consultation requested by: Mosie Lukes, MD  Subjective:   HPI: Patient is a 37 y.o. male here for right shoulder pain.  Patient reports for the past 2-3 months he's had pain in anterior, lateral right shoulder. No acute injury or trauma. Pain currently a 1/10 and sore at worst. Last time he worked out actually was feeling better. Tends to bother him when doing shoulder overhead presses. Can feel like something is stuck in anterior shoulder. No skin changes, numbness. Is right handed.  Past Medical History:  Diagnosis Date  . Allergy    seasonal mostly in spring  . Asthma    childhood  . Diabetes mellitus type 2, controlled ([place]) 10/01/2017  . Gout    left knee  . Hyperlipidemia   . Hyperlipidemia associated with type 2 diabetes mellitus ([place]) 10/06/2017  . Preventative health care 10/06/2017  . Pseudogout    left knee    Current Outpatient Medications on File Prior to Visit  Medication Sig Dispense Refill  . colchicine 0.6 MG tablet Take 1 tablet (0.6 mg total) by mouth daily. 30 tablet 3  . ibuprofen (ADVIL,MOTRIN) 200 MG tablet Take 1-2 tablets (200-400 mg total) by mouth every 6 (six) hours as needed. 30 tablet 0   No current facility-administered medications on file prior to visit.     Past Surgical History:  Procedure Laterality Date  . KNEE SURGERY    . KNEE SURGERY  2014   arthroscopy left knee, found pseudogout, and meniscal tea    No Known Allergies  Social History   Socioeconomic History  . Marital status: Single    Spouse name: Not on file  . Number of children: Not on file  . Years of education: Not on file  . Highest education level: Not on file  Social Needs  . Financial resource strain: Not on file  . Food insecurity - worry: Not on file  . Food insecurity - inability: Not on file  . Transportation needs - medical: Not on file  . Transportation needs - non-medical: Not on file  Occupational History  . Not on file  Tobacco Use   . Smoking status: Never Smoker  . Smokeless tobacco: Never Used  Substance and Sexual Activity  . Alcohol use: Yes    Alcohol/week: 0.0 oz  . Drug use: No  . Sexual activity: Not on file  Other Topics Concern  . Not on file  Social History Narrative   Occupation: currently working for [employer]   Lives with wife and mom   Never Smoked    Alcohol use-yes (social)   No dietary restrictions, intermittent fasting           Family History  Problem Relation Age of Onset  . Diabetes Unknown   . Hyperlipidemia Unknown   . Hypertension Unknown   . Hypertension Mother   . Diabetes Father   . Heart disease Father   . Asthma Daughter   . Stroke Maternal Grandmother   . Stroke Maternal Grandfather     BP (!) 131/100   Pulse 93   Ht 5\' 4"  (1.626 m)   Wt 191 lb (86.6 kg)   BMI 32.79 kg/m   Review of Systems: See HPI above.     Objective:  Physical Exam:  Gen: NAD, comfortable in exam room  Right shoulder: No swelling, ecchymoses.  No gross deformity. Minimal TTP anteriorly over proximal biceps tendon.  No subluxation on abduction/ER. FROM with mild painful arc. Negative Luan Pulling,  Neers. Negative Yergasons. Strength 5/5 with empty can and resisted internal/external rotation. Negative apprehension. Negative o'briens. NV intact distally.  Left shoulder: No swelling, ecchymoses.  No gross deformity. No TTP. FROM. Strength 5/5 with empty can and resisted internal/external rotation. NV intact distally.   Assessment & Plan:  1. Right shoulder pain - Exam is reassuring.  Consistent with mild proximal biceps tendinopathy and rotator cuff impingement.  Reviewed home exercises to do daily.  Tylenol, ibuprofen if needed.  Consider PT if not improving as expected.  F/u in 5 weeks if not improving.

## 2018-01-16 NOTE — Telephone Encounter (Signed)
lmtcb x1 to make pt aware of below message. Will place order to change settings after speaking with pt.

## 2018-01-16 NOTE — Telephone Encounter (Signed)
It appears that order was placed on 01/02/18 for auto pap 5-20cm.  RA please advise if settings should be changed to 5-15cm. Thanks.

## 2018-01-16 NOTE — Telephone Encounter (Signed)
Yes

## 2018-01-20 NOTE — Telephone Encounter (Signed)
Pt is aware of below message and voiced his understanding. Order for cpap 5-15cm has been ordered.  Pt has been scheduled for OV on 03/13/18. Nothing further is needed.

## 2018-01-23 ENCOUNTER — Encounter (HOSPITAL_BASED_OUTPATIENT_CLINIC_OR_DEPARTMENT_OTHER): Payer: 59

## 2018-02-17 ENCOUNTER — Encounter: Payer: Self-pay | Admitting: Family Medicine

## 2018-03-13 ENCOUNTER — Ambulatory Visit: Payer: 59 | Admitting: Pulmonary Disease

## 2018-03-13 ENCOUNTER — Other Ambulatory Visit: Payer: Self-pay | Admitting: Family Medicine

## 2018-03-19 ENCOUNTER — Ambulatory Visit (INDEPENDENT_AMBULATORY_CARE_PROVIDER_SITE_OTHER): Payer: 59 | Admitting: Acute Care

## 2018-03-19 ENCOUNTER — Encounter: Payer: Self-pay | Admitting: Acute Care

## 2018-03-19 DIAGNOSIS — G4733 Obstructive sleep apnea (adult) (pediatric): Secondary | ICD-10-CM

## 2018-03-19 NOTE — Assessment & Plan Note (Addendum)
Therapeutic results with compliance with CPAP AHI is 3.2 More rested No longer needs daytime naps Plan: Continue on CPAP at bedtime. You appear to be benefiting from the treatment Goal is to wear for at least 6 hours each night for maximal clinical benefit. Continue to work on weight loss, as the link between excess weight  and sleep apnea is well established.  Do not drive if sleepy. Remember to clean mask, tubing, filter, and reservoir once weekly with soapy water.  Follow up with Dr. Elsworth Soho  In 6 months or before as needed.  Please contact office for sooner follow up if symptoms do not improve or worsen or seek emergency care

## 2018-03-19 NOTE — Progress Notes (Signed)
History of Present Illness Steven Powell is a 37 y.o. male never smoker  with OSA on CPAP. He is followed by Dr. Elsworth Soho  11/2017>> HST showed severe OSA with 77 events per hour. Suggests auto cpap 5-15 cm, mask of choice, DL in 4 weeks and OV in 4 weeks . Also recommends cpap titration.    03/19/2018  Pt. Presents for follow up after initiation of CPAP. He states he has been doing well. He was seen by Dr. Elsworth Soho 12/02/2017. He has been using his CPAP machine. Pt. States it took him about a week to get used to it. He states it is like night and day as he feel he is more clear each morning now. He no longer needs to nap during the day. He has no night time awakenings.He feels more focused and less sleepy during the day.He denies fever, chest pain, orthopnea or hemoptysis.  Test Results: Down Load: AirSense 10 AutoSet Usage 30/30 days > 4 hours 30 days Average usage 7 hours and 40 minutes Median pressure 11.2 cm H2O Maximum pressure 15.6 cm H2O AHI is 3.2   CBC Latest Ref Rng & Units 01/02/2018 10/01/2017 03/02/2008  WBC 4.0 - 10.5 K/uL 7.9 9.1 7.3  Hemoglobin 13.0 - 17.0 g/dL 16.7 15.9 15.7  Hematocrit 39.0 - 52.0 % 50.7 48.3 47.4  Platelets 150.0 - 400.0 K/uL 348.0 411.0(H) 317    BMP Latest Ref Rng & Units 01/02/2018 10/01/2017 04/19/2011  Glucose 70 - 99 mg/dL 91 105(H) 113(H)  BUN 6 - 23 mg/dL 12 11 14   Creatinine 0.40 - 1.50 mg/dL 1.21 1.27 1.35  Sodium 135 - 145 mEq/L 142 139 141  Potassium 3.5 - 5.1 mEq/L 4.2 5.0 4.6  Chloride 96 - 112 mEq/L 103 103 107  CO2 19 - 32 mEq/L 28 27 23   Calcium 8.4 - 10.5 mg/dL 10.2 9.9 9.2    BNP No results found for: BNP  ProBNP No results found for: PROBNP  PFT No results found for: FEV1PRE, FEV1POST, FVCPRE, FVCPOST, TLC, DLCOUNC, PREFEV1FVCRT, PSTFEV1FVCRT  No results found.   Past medical hx Past Medical History:  Diagnosis Date  . Allergy    seasonal mostly in spring  . Asthma    childhood  . Diabetes mellitus type 2, controlled  ([place]) 10/01/2017  . Gout    left knee  . Hyperlipidemia   . Hyperlipidemia associated with type 2 diabetes mellitus ([place]) 10/06/2017  . Preventative health care 10/06/2017  . Pseudogout    left knee     Social History   Tobacco Use  . Smoking status: Never Smoker  . Smokeless tobacco: Never Used  Substance Use Topics  . Alcohol use: Yes    Alcohol/week: 0.0 oz  . Drug use: No    Mr.Guay reports that  has never smoked. he has never used smokeless tobacco. He reports that he drinks alcohol. He reports that he does not use drugs.  Tobacco Cessation: Never smoker  Past surgical hx, Family hx, Social hx all reviewed.  Current Outpatient Medications on File Prior to Visit  Medication Sig  . allopurinol (ZYLOPRIM) 100 MG tablet TAKE 1 TABLET BY MOUTH EVERY DAY  . atorvastatin (LIPITOR) 10 MG tablet Take 1 tablet (10 mg total) by mouth at bedtime.  . colchicine 0.6 MG tablet Take 1 tablet (0.6 mg total) by mouth daily.  Marland Kitchen ibuprofen (ADVIL,MOTRIN) 200 MG tablet Take 1-2 tablets (200-400 mg total) by mouth every 6 (six) hours as needed.   No  current facility-administered medications on file prior to visit.      No Known Allergies  Review Of Systems:  Constitutional:   No  weight loss, night sweats,  Fevers, chills, fatigue, or  lassitude.  HEENT:   No headaches,  Difficulty swallowing,  Tooth/dental problems, or  Sore throat,                No sneezing, itching, ear ache, nasal congestion, post nasal drip,   CV:  No chest pain,  Orthopnea, PND, swelling in lower extremities, anasarca, dizziness, palpitations, syncope.   GI  No heartburn, indigestion, abdominal pain, nausea, vomiting, diarrhea, change in bowel habits, loss of appetite, bloody stools.   Resp: No shortness of breath with exertion or at rest.  No excess mucus, no productive cough,  No non-productive cough,  No coughing up of blood.  No change in color of mucus.  No wheezing.  No chest wall deformity  Skin: no  rash or lesions.  GU: no dysuria, change in color of urine, no urgency or frequency.  No flank pain, no hematuria   MS:  No joint pain or swelling.  No decreased range of motion.  No back pain.  Psych:  No change in mood or affect. No depression or anxiety.  No memory loss.   Vital Signs BP 122/76 (BP Location: Left Arm, Cuff Size: Normal)   Pulse 78   Ht 5\' 4"  (1.626 m)   Wt 195 lb (88.5 kg)   SpO2 100%   BMI 33.47 kg/m    Physical Exam:  General- No distress,  A&Ox3, pleasant ENT: No sinus tenderness, TM clear, pale nasal mucosa, no oral exudate,no post nasal drip, no LAN Cardiac: S1, S2, regular rate and rhythm, no murmur Chest: No wheeze/ rales/ dullness; no accessory muscle use, no nasal flaring, no sternal retractions Abd.: Soft Non-tender, non-distended, Obese Ext: No clubbing cyanosis, edema Neuro:  normal strength, MAE x 4, A&O x 3 Skin: No rashes, warm and dry Psych: normal mood and behavior   Assessment/Plan  OSA (obstructive sleep apnea) Therapeutic results with compliance with CPAP AHI is 3.2 More rested No longer needs daytime naps Plan: Continue on CPAP at bedtime. You appear to be benefiting from the treatment Goal is to wear for at least 6 hours each night for maximal clinical benefit. Continue to work on weight loss, as the link between excess weight  and sleep apnea is well established.  Do not drive if sleepy. Remember to clean mask, tubing, filter, and reservoir once weekly with soapy water.  Follow up with Dr. Elsworth Soho  In 6 months or before as needed.  Please contact office for sooner follow up if symptoms do not improve or worsen or seek emergency care      Magdalen Spatz, NP 03/19/2018  2:46 PM

## 2018-03-19 NOTE — Patient Instructions (Signed)
It is good to see you today Continue on CPAP at bedtime. You appear to be benefiting from the treatment Goal is to wear for at least 6 hours each night for maximal clinical benefit. Continue to work on weight loss, as the link between excess weight  and sleep apnea is well established.  Do not drive if sleepy. Remember to clean mask, tubing, filter, and reservoir once weekly with soapy water.  Follow up with Dr. Elsworth Soho  In 6 months or before as needed.  Please contact office for sooner follow up if symptoms do not improve or worsen or seek emergency care

## 2018-03-26 ENCOUNTER — Other Ambulatory Visit: Payer: Self-pay

## 2018-03-26 MED ORDER — COLCHICINE 0.6 MG PO TABS: 0.6000 mg | ORAL_TABLET | Freq: Every day | ORAL | 1 refills | Status: DC

## 2018-05-05 ENCOUNTER — Ambulatory Visit: Payer: 59 | Admitting: Family Medicine

## 2018-05-06 ENCOUNTER — Encounter: Payer: Self-pay | Admitting: Family Medicine

## 2018-05-07 ENCOUNTER — Encounter: Payer: Self-pay | Admitting: Family Medicine

## 2018-05-07 ENCOUNTER — Ambulatory Visit (INDEPENDENT_AMBULATORY_CARE_PROVIDER_SITE_OTHER): Payer: 59 | Admitting: Family Medicine

## 2018-05-07 VITALS — BP 118/82 | HR 96 | Temp 98.2°F | Ht 64.0 in | Wt 193.8 lb

## 2018-05-07 DIAGNOSIS — B029 Zoster without complications: Secondary | ICD-10-CM | POA: Diagnosis not present

## 2018-05-07 NOTE — Patient Instructions (Signed)
I am very sorry that you have shingles like this!  Continue to take the valtrex, and rest.  We will see you for a recheck on Monday.  If you have any lesions on your eyelids, the end of your nose, or any eye symptoms please seek care from [place] or your eye doctor immediately

## 2018-05-07 NOTE — Progress Notes (Signed)
[place] at [facility] [address] [place], [place] [address] [phone]  Date:  05/07/2018   Name:  Steven Powell   DOB:  [birth year]   MRN:  [id number]  PCP:  Mosie Lukes, MD    Chief Complaint: Herpes Zoster (c/o shingles on face. Since in Urgent Care on this past Sunday. )   History of Present Illness:  Steven Powell is a 37 y.o. very pleasant male patient who presents with the following:  Generally in good health except for controlled DM, hyperlipidemia, obesity Lab Results  Component Value Date   HGBA1C 6.4 01/02/2018   About a week ago he noted a feeling of soreness and tingling on his right scalp and right ear.  Then he developed concerning skin lesions- he went to UC and was dx with shingle, started on valtrex   He was dx with shingles at UC this past Sunday- today is Wednesday They started him on valtrex-it does seem to be responding and getting better  He has not noted any sx of his right eye and does not have any rash or sx on the end of his nose No fever or chills He otherwise feels ok He works directly with customers face to face and does not feel like he can RTW looking as he does currently and I understand his concern He did have chicken pox as a child His wife is with him- she is not pregnant and they do not have young children at home   Patient Active Problem List   Diagnosis Date Noted  . Right shoulder pain 01/02/2018  . 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 Hyperlipidemia associated with type 2 diabetes mellitus ([place]) 10/06/2017  . Preventative health care 10/06/2017  . Left knee pain 10/01/2017  . Diabetes mellitus type 2, controlled ([place]) 10/01/2017  . OSA (obstructive sleep apnea) 10/01/2017  . Allergy   . Gout   . Family history of diabetes mellitus 05/03/2011  . General medical examination 05/03/2011  . Reactive airway disease 04/19/2011    Past Medical History:  Diagnosis Date   . Allergy    seasonal mostly in spring  . Asthma    childhood  . Diabetes mellitus type 2, controlled ([place]) 10/01/2017  . Gout    left knee  . Hyperlipidemia   . Hyperlipidemia associated with type 2 diabetes mellitus ([place]) 10/06/2017  . Preventative health care 10/06/2017  . Pseudogout    left knee    Past Surgical History:  Procedure Laterality Date  . KNEE SURGERY    . KNEE SURGERY  2014   arthroscopy left knee, found pseudogout, and meniscal tea    Social History   Tobacco Use  . Smoking status: Never Smoker  . Smokeless tobacco: Never Used  Substance Use Topics  . Alcohol use: Yes    Alcohol/week: 0.0 oz  . Drug use: No    Family History  Problem Relation Age of Onset  . Diabetes Unknown   . Hyperlipidemia Unknown   . Hypertension Unknown   . Hypertension Mother   . Diabetes Father   . Heart disease Father   . Asthma Daughter   . Stroke Maternal Grandmother   . Stroke Maternal Grandfather     No Known Allergies  Medication list has been reviewed and updated.  Current Outpatient Medications on File Prior to Visit  Medication Sig Dispense Refill  . allopurinol (ZYLOPRIM) 100 MG tablet TAKE 1 TABLET  BY MOUTH EVERY DAY 30 tablet 2  . colchicine 0.6 MG tablet Take 1 tablet (0.6 mg total) by mouth daily. 90 tablet 1  . ibuprofen (ADVIL,MOTRIN) 200 MG tablet Take 1-2 tablets (200-400 mg total) by mouth every 6 (six) hours as needed. 30 tablet 0   No current facility-administered medications on file prior to visit.     Review of Systems:  As per HPI- otherwise negative. No fever Pain from the rash is getting better   Physical Examination: Vitals:   05/07/18 1309  BP: 118/82  Pulse: 96  Temp: 98.2 F (36.8 C)  SpO2: 98%   Vitals:   05/07/18 1309  Weight: 193 lb 12.8 oz (87.9 kg)  Height: 5\' 4"  (1.626 m)   Body mass index is 33.27 kg/m. Ideal Body Weight: Weight in (lb) to have BMI = 25: 145.3  GEN: WDWN, NAD, Non-toxic, A & O x 3, obese,  looks well except for rash  HEENT: Atraumatic, Normocephalic. Neck supple. No masses, No LAD.  Bilateral TM wnl, oropharynx normal.  PEERL,EOMI.   No facial drooping or paralysis He has vesicles typical of shingles in the V3 distrubution on the right face - right temple, right cheek, right chin.  Some are crusted but some are still actively vesicular  Ears and Nose: No external deformity. CV: RRR, No M/G/R. No JVD. No thrill. No extra heart sounds. PULM: CTA B, no wheezes, crackles, rhonchi. No retractions. No resp. distress. No accessory muscle use. EXTR: No c/c/e NEURO Normal gait.  PSYCH: Normally interactive. Conversant. Not depressed or anxious appearing.  Calm demeanor.   Assessment and Plan Herpes zoster without complication  Shingles of right face No eye involvement, in V3 dermatome  Continue valtrex Note for work He will see me Monday for a recheck See patient instructions for more details.    Signed Lamar Blinks, MD

## 2018-05-10 NOTE — Progress Notes (Signed)
[place] at [facility] [address], [place], [place] [address] [phone]  Date:  05/12/2018   Name:  Steven Powell   DOB:  [birth year]   MRN:  [id number]  PCP:  Mosie Lukes, MD    Chief Complaint: Herpes Zoster (follow up, starting to feel better)   History of Present Illness:  Steven Powell is a 37 y.o. 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 He is feeling much better now, rested over the weekend. Lesions on his face are much better and healing, mostly crusted over.  He should be able to safely RTW as planned on 5/13   Lab Results  Component Value Date   HGBA1C 6.4 01/02/2018   His DM is diet controlled  He has recently had sx of gout as well  Patient Active Problem List   Diagnosis Date Noted  . Right shoulder pain 01/02/2018  . 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 Hyperlipidemia associated with type 2 diabetes mellitus ([place]) 10/06/2017  . Left knee pain 10/01/2017  . Diabetes mellitus type 2, controlled ([place]) 10/01/2017  . OSA (obstructive sleep apnea) 10/01/2017  . Gout   . Reactive airway disease 04/19/2011    Past Medical History:  Diagnosis Date  . Allergy    seasonal mostly in spring  . Asthma    childhood  . Diabetes mellitus type 2, controlled ([place]) 10/01/2017  . Gout    left knee  . Hyperlipidemia   . Hyperlipidemia associated with type 2 diabetes mellitus ([place]) 10/06/2017  . Preventative health care 10/06/2017  . Pseudogout    left knee    Past Surgical History:  Procedure Laterality Date  . KNEE SURGERY    . KNEE SURGERY  2014   arthroscopy left knee, found pseudogout, and meniscal tea    Social History   Tobacco Use  . Smoking status: Never Smoker  . Smokeless tobacco: Never Used  Substance Use Topics  . Alcohol use: Yes    Alcohol/week: 0.0 oz  . Drug use: No    Family History   Problem Relation Age of Onset  . Diabetes Unknown   . Hyperlipidemia Unknown   . Hypertension Unknown   . Hypertension Mother   . Diabetes Father   . Heart disease Father   . Asthma Daughter   . Stroke Maternal Grandmother   . Stroke Maternal Grandfather     No Known Allergies  Medication list has been reviewed and updated.  Current Outpatient Medications on File Prior to Visit  Medication Sig Dispense Refill  . allopurinol (ZYLOPRIM) 100 MG tablet TAKE 1 TABLET BY MOUTH EVERY DAY 30 tablet 2  . colchicine 0.6 MG tablet Take 1 tablet (0.6 mg total) by mouth daily. 90 tablet 1  . ibuprofen (ADVIL,MOTRIN) 200 MG tablet Take 1-2 tablets (200-400 mg total) by mouth every 6 (six) hours as needed. 30 tablet 0  . valACYclovir (VALTREX) 1000 MG tablet Take 1,000 mg by mouth 3 (three) times daily.  0   No current facility-administered medications on file prior to visit.     Review of Systems:  As per HPI- otherwise negative.   Physical Examination: Vitals:   05/12/18 0851  BP: 124/82  Pulse: 78  Resp: 16  SpO2: 98%   Vitals:   05/12/18 0851  Weight: 197 lb 12.8 oz (89.7 kg)  Height:  5\' 4"  (1.626 m)   Body mass index is 33.95 kg/m. Ideal Body Weight: Weight in (lb) to have BMI = 25: 145.3  GEN: WDWN, NAD, Non-toxic, A & O x 3, overweight.  Facial zoster is much better  HEENT: Atraumatic, Normocephalic. Neck supple. No masses, No LAD. Bilateral TM wnl, oropharynx normal.  PEERL,EOMI.   He now has scabbed over lesions on the right cheek and chin- much better.  No longer has active vesicles  Ears and Nose: No external deformity. CV: RRR, No M/G/R. No JVD. No thrill. No extra heart sounds. PULM: CTA B, no wheezes, crackles, rhonchi. No retractions. No resp. distress. No accessory muscle use.Marland Kitchen EXTR: No c/c/e NEURO Normal gait.  PSYCH: Normally interactive. Conversant. Not depressed or anxious appearing.  Calm demeanor.    Assessment and Plan: Herpes zoster without  complication  Following up today Doing much better Facial rash is healing well, mostly crusted over [facility] to RTW on Wednesday 5/15 Completed disability paperwork for him today   Signed Lamar Blinks, MD

## 2018-05-12 ENCOUNTER — Encounter: Payer: Self-pay | Admitting: Family Medicine

## 2018-05-12 ENCOUNTER — Ambulatory Visit (INDEPENDENT_AMBULATORY_CARE_PROVIDER_SITE_OTHER): Payer: 59 | Admitting: Family Medicine

## 2018-05-12 VITALS — BP 124/82 | HR 78 | Resp 16 | Ht 64.0 in | Wt 197.8 lb

## 2018-05-12 DIAGNOSIS — B029 Zoster without complications: Secondary | ICD-10-CM | POA: Diagnosis not present

## 2018-06-15 ENCOUNTER — Encounter (HOSPITAL_COMMUNITY): Payer: Self-pay

## 2018-06-15 ENCOUNTER — Emergency Department (HOSPITAL_COMMUNITY): Payer: 59

## 2018-06-15 ENCOUNTER — Emergency Department (HOSPITAL_COMMUNITY)
Admission: EM | Admit: 2018-06-15 | Discharge: 2018-06-15 | Disposition: A | Discharge status: Home / Self Care | Payer: 59 | Attending: Emergency Medicine | Admitting: Emergency Medicine

## 2018-06-15 ENCOUNTER — Other Ambulatory Visit: Payer: Self-pay

## 2018-06-15 DIAGNOSIS — Y929 Unspecified place or not applicable: Secondary | ICD-10-CM | POA: Diagnosis not present

## 2018-06-15 DIAGNOSIS — Y939 Activity, unspecified: Secondary | ICD-10-CM | POA: Insufficient documentation

## 2018-06-15 DIAGNOSIS — Y33XXXA Other specified events, undetermined intent, initial encounter: Secondary | ICD-10-CM | POA: Diagnosis not present

## 2018-06-15 DIAGNOSIS — S161XXA Strain of muscle, fascia and tendon at neck level, initial encounter: Secondary | ICD-10-CM

## 2018-06-15 DIAGNOSIS — E119 Type 2 diabetes mellitus without complications: Secondary | ICD-10-CM | POA: Diagnosis not present

## 2018-06-15 DIAGNOSIS — K219 Gastro-esophageal reflux disease without esophagitis: Secondary | ICD-10-CM | POA: Insufficient documentation

## 2018-06-15 DIAGNOSIS — J45909 Unspecified asthma, uncomplicated: Secondary | ICD-10-CM | POA: Insufficient documentation

## 2018-06-15 DIAGNOSIS — Y998 Other external cause status: Secondary | ICD-10-CM | POA: Insufficient documentation

## 2018-06-15 DIAGNOSIS — E785 Hyperlipidemia, unspecified: Secondary | ICD-10-CM | POA: Diagnosis not present

## 2018-06-15 DIAGNOSIS — Z79899 Other long term (current) drug therapy: Secondary | ICD-10-CM | POA: Insufficient documentation

## 2018-06-15 DIAGNOSIS — M791 Myalgia, unspecified site: Secondary | ICD-10-CM

## 2018-06-15 DIAGNOSIS — R0602 Shortness of breath: Secondary | ICD-10-CM | POA: Diagnosis present

## 2018-06-15 LAB — BASIC METABOLIC PANEL
Anion gap: 9 (ref 5–15)
BUN: 12 mg/dL (ref 6–20)
CO2: 25 mmol/L (ref 22–32)
CREATININE: 1.21 mg/dL (ref 0.61–1.24)
Calcium: 9.5 mg/dL (ref 8.9–10.3)
Chloride: 104 mmol/L (ref 101–111)
Glucose, Bld: 109 mg/dL — ABNORMAL HIGH (ref 65–99)
Potassium: 3.6 mmol/L (ref 3.5–5.1)
SODIUM: 138 mmol/L (ref 135–145)

## 2018-06-15 LAB — CBC
HEMATOCRIT: 47.7 % (ref 39.0–52.0)
Hemoglobin: 15.5 g/dL (ref 13.0–17.0)
MCH: 29.8 pg (ref 26.0–34.0)
MCHC: 32.5 g/dL (ref 30.0–36.0)
MCV: 91.6 fL (ref 78.0–100.0)
PLATELETS: 283 10*3/uL (ref 150–400)
RBC: 5.21 MIL/uL (ref 4.22–5.81)
RDW: 13.2 % (ref 11.5–15.5)
WBC: 7.1 10*3/uL (ref 4.0–10.5)

## 2018-06-15 LAB — I-STAT TROPONIN, ED: Troponin i, poc: 0 ng/mL (ref 0.00–0.08)

## 2018-06-15 NOTE — ED Notes (Signed)
Pt verbalizes understanding of d/c instructions. Pt ambulatory at d/c with all belongings and with family.   

## 2018-06-15 NOTE — ED Provider Notes (Signed)
[facility] EMERGENCY DEPARTMENT Provider Note   CSN: [id number] Arrival date & time: 06/15/18  2037     History   Chief Complaint Chief Complaint  Patient presents with  . Shortness of Breath    HPI Steven Powell is a 37 y.o. male presenting for evaluation of left arm pain, left-sided neck pain, and shortness of breath.  Patient states that yesterday, he developed left lower arm pain.  It was present with certain positions, no pain at rest.  This has since resolved.  Additionally, patient reports left-sided neck pain when he turns his head to the left.  No pain at rest.  He denies fall, trauma, or injury.  He has not taken anything for pain including Tylenol or ibuprofen.  It does not radiate anywhere.  Additionally, patient states he feels short of breath after eating for the past 2 days.  He states it resolves when he gets distracted.  He has no shortness of breath other times, states this has happened every single time he eats.  This began after he ate several spicy meals in a row.  He has a history of GERD, has not taken any Tums or GERD medications.  He denies chest pain.  He denies fevers, chills, sore throat, cough, nausea, vomiting, abdominal pain, urinary symptoms, abnormal bowel movements.  He has no history of heart or lung problems.  He has a history of gout, osa. No new medications, takes colchicine and allopurinol daily.   HPI  Past Medical History:  Diagnosis Date  . Allergy    seasonal mostly in spring  . Asthma    childhood  . Diabetes mellitus type 2, controlled ([place]) 10/01/2017  . Gout    left knee  . Hyperlipidemia   . Hyperlipidemia associated with type 2 diabetes mellitus ([place]) 10/06/2017  . Preventative health care 10/06/2017  . Pseudogout    left knee    Patient Active Problem List   Diagnosis Date Noted  . Right shoulder pain 01/02/2018  . 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 Hyperlipidemia associated with type 2 diabetes  mellitus ([place]) 10/06/2017  . Left knee pain 10/01/2017  . Diabetes mellitus type 2, controlled ([place]) 10/01/2017  . OSA (obstructive sleep apnea) 10/01/2017  . Gout   . Reactive airway disease 04/19/2011    Past Surgical History:  Procedure Laterality Date  . KNEE SURGERY    . KNEE SURGERY  2014   arthroscopy left knee, found pseudogout, and meniscal tea        Home Medications    Prior to Admission medications   Medication Sig Start Date End Date Taking? Authorizing Provider  allopurinol (ZYLOPRIM) 100 MG tablet TAKE 1 TABLET BY MOUTH EVERY DAY 03/13/18  Yes Mosie Lukes, MD  colchicine 0.6 MG tablet Take 1 tablet (0.6 mg total) by mouth daily. 03/26/18  Yes Mosie Lukes, MD  ibuprofen (ADVIL,MOTRIN) 200 MG tablet Take 1-2 tablets (200-400 mg total) by mouth every 6 (six) hours as needed. 10/01/17  Yes Mosie Lukes, MD  meloxicam (MOBIC) 15 MG tablet Take 15 mg by mouth daily as needed for pain. 06/02/18   [provider]    Family History Family History  Problem Relation Age of Onset  . Diabetes Unknown   . Hyperlipidemia Unknown   . Hypertension Unknown   . Hypertension Mother   . Diabetes Father   . Heart disease Father   . Asthma Daughter   . Stroke Maternal Grandmother   .  Stroke Maternal Grandfather     Social History Social History   Tobacco Use  . Smoking status: Never Smoker  . Smokeless tobacco: Never Used  Substance Use Topics  . Alcohol use: Yes    Alcohol/week: 0.0 oz  . Drug use: No     Allergies   Patient has no known allergies.   Review of Systems Review of Systems  Respiratory: Positive for shortness of breath.   Musculoskeletal: Positive for myalgias and neck pain.  All other systems reviewed and are negative.    Physical Exam Updated Vital Signs BP (!) 165/105   Pulse 82   Temp 98 F (36.7 C)   Resp 20   SpO2 100%   Physical Exam  Constitutional: He is oriented to person, place, and time. He appears  well-developed and well-nourished. No distress.  Patient appears in no distress.  HENT:  Head: Normocephalic and atraumatic.  Eyes: Pupils are equal, round, and reactive to light. Conjunctivae and EOM are normal.  Neck: Normal range of motion. Neck supple.  Tenderness palpation of left-sided neck musculature.  Pain when looking to the left.  No pain with looking to the right.  No pain over midline C-spine.  No step-offs.  Cardiovascular: Normal rate, regular rhythm and intact distal pulses.  Pulmonary/Chest: Effort normal and breath sounds normal. No respiratory distress. He has no wheezes. He exhibits no tenderness.  Speaking in full sentences.  Clear lung sounds in all fields.  Abdominal: Soft. He exhibits no distension. There is no tenderness.  Musculoskeletal: Normal range of motion.  No tenderness to palpation of left forearm.  No erythema, warmth, swelling, or deformities.  Full active range of motion of upper extremity without pain.  Grip strength intact bilaterally.  Radial pulses intact bilaterally.  Good cap refill.  Neurological: He is alert and oriented to person, place, and time.  Skin: Skin is warm and dry.  Psychiatric: He has a normal mood and affect.  Nursing note and vitals reviewed.    ED Treatments / Results  Labs (all labs ordered are listed, but only abnormal results are displayed) Labs Reviewed  BASIC METABOLIC PANEL - Abnormal; Notable for the following components:      Result Value   Glucose, Bld 109 (*)    All other components within normal limits  CBC  I-STAT TROPONIN, ED    EKG EKG Interpretation  Date/Time:  Sunday June 15 2018 20:42:05 EDT Ventricular Rate:  88 PR Interval:  164 QRS Duration: 72 QT Interval:  358 QTC Calculation: 433 R Axis:   19 Text Interpretation:  Normal sinus rhythm with sinus arrhythmia Normal ECG no prior to compare with Confirmed by Aletta Edouard [phone]) on 06/15/2018 11:06:16 PM   Radiology Dg Chest 2  View  Result Date: 06/15/2018 CLINICAL DATA:  Chest pain EXAM: CHEST - 2 VIEW COMPARISON:  08/26/2009 FINDINGS: Normal mediastinum and cardiac silhouette. Normal pulmonary vasculature. No evidence of effusion, infiltrate, or pneumothorax. No acute bony abnormality. IMPRESSION: No acute cardiopulmonary process. Electronically Signed   By: Suzy Bouchard M.D.   On: 06/15/2018 21:23    Procedures Procedures (including critical care time)  Medications Ordered in ED Medications - No data to display   Initial Impression / Assessment and Plan / ED Course  I have reviewed the triage vital signs and the nursing notes.  Pertinent labs & imaging results that were available during my care of the patient were reviewed by me and considered in my medical decision making (see  chart for details).     Patient presenting for evaluation of left arm pain, left-sided neck pain, shortness of breath.  Physical exam reassuring, he is afebrile not tachycardic.  He appears nontoxic.  Arm pain has resolved, no signs of vascular emergency or infection.  Pain was positional, likely muscular.  Neck pain is reproducible and present with mvoement.  Likely muscular.  Doubt cervical spine injury, no midline tenderness.  Shortness of breath associated with oral intake.  No shortness of breath other times.  No signs of respiratory distress.  X-ray viewed and interpreted by me, no pneumothorax, pneumonia, or effusions.  EKG without STEMI.  Labs reassuring, troponin negative.  Likely GERD.  At this time, doubt ACS, PE, infection.  Discussed with patient.  Discussed symptomatic treatment Tylenol and Tums.  Follow-up with primary care for further evaluation.  At this time, patient be safe for discharge.  Return precautions given.  Patient states he understands and agrees to plan.  Final Clinical Impressions(s) / ED Diagnoses   Final diagnoses:  Gastroesophageal reflux disease, esophagitis presence not specified  Muscle pain   Strain of neck muscle, initial encounter    ED Discharge Orders    None       Franchot Heidelberg, PA-C 06/16/18 0010    Hayden Rasmussen, MD 06/16/18 1148

## 2018-06-15 NOTE — Discharge Instructions (Addendum)
Take Tylenol as needed for pain. Use Tums or Maalox as needed for symptoms that develop after eating. Follow-up with your primary care doctor by the end of this week if your symptoms are not improving. Return to the emergency room if you develop fevers, persistent difficulty breathing, chest pain, or any new or concerning symptoms.

## 2018-06-15 NOTE — ED Triage Notes (Signed)
Pt reports that yesterday he began to have L arm pain that now radiates to his shoulder, neck and making him feel SOB, denies CP

## 2018-06-16 ENCOUNTER — Other Ambulatory Visit: Payer: Self-pay | Admitting: Family Medicine

## 2018-07-04 ENCOUNTER — Ambulatory Visit: Payer: 59 | Admitting: Family Medicine

## 2018-07-07 ENCOUNTER — Ambulatory Visit (INDEPENDENT_AMBULATORY_CARE_PROVIDER_SITE_OTHER): Payer: 59 | Admitting: Family Medicine

## 2018-07-07 ENCOUNTER — Ambulatory Visit: Payer: 59 | Admitting: Family Medicine

## 2018-07-07 ENCOUNTER — Encounter: Payer: Self-pay | Admitting: Family Medicine

## 2018-07-07 VITALS — BP 119/84 | HR 90 | Temp 98.0°F | Resp 18 | Ht 64.0 in | Wt 202.2 lb

## 2018-07-07 DIAGNOSIS — E113531 Type 2 diabetes mellitus with proliferative diabetic retinopathy with traction retinal detachment not involving the macula, right eye: Secondary | ICD-10-CM | POA: Diagnosis not present

## 2018-07-07 DIAGNOSIS — M25511 Pain in right shoulder: Secondary | ICD-10-CM

## 2018-07-07 DIAGNOSIS — M109 Gout, unspecified: Secondary | ICD-10-CM | POA: Diagnosis not present

## 2018-07-07 DIAGNOSIS — E1169 Type 2 diabetes mellitus with other specified complication: Secondary | ICD-10-CM

## 2018-07-07 DIAGNOSIS — E785 Hyperlipidemia, unspecified: Secondary | ICD-10-CM

## 2018-07-07 DIAGNOSIS — E669 Obesity, unspecified: Secondary | ICD-10-CM

## 2018-07-07 DIAGNOSIS — K219 Gastro-esophageal reflux disease without esophagitis: Secondary | ICD-10-CM | POA: Insufficient documentation

## 2018-07-07 MED ORDER — ALLOPURINOL 100 MG PO TABS: 100.0000 mg | ORAL_TABLET | Freq: Every day | ORAL | 1 refills | Status: DC

## 2018-07-07 NOTE — Assessment & Plan Note (Signed)
Encouraged DASH or MIND diet, decrease po intake and increase exercise as tolerated. Needs 7-8 hours of sleep nightly. Avoid trans fats, eat small, frequent meals every 4-5 hours with lean proteins, complex carbs and healthy fats. Minimize simple carbs. Was doing intermittent fasting until he injured his shoulder and was loosing weight.

## 2018-07-07 NOTE — Patient Instructions (Signed)
MIND diet   DASH Eating Plan DASH stands for "Dietary Approaches to Stop Hypertension." The DASH eating plan is a healthy eating plan that has been shown to reduce high blood pressure (hypertension). It may also reduce your risk for type 2 diabetes, heart disease, and stroke. The DASH eating plan may also help with weight loss. What are tips for following this plan? General guidelines  Avoid eating more than 2,300 mg (milligrams) of salt (sodium) a day. If you have hypertension, you may need to reduce your sodium intake to 1,500 mg a day.  Limit alcohol intake to no more than 1 drink a day for nonpregnant women and 2 drinks a day for men. One drink equals 12 oz of beer, 5 oz of wine, or 1 oz of hard liquor.  Work with your health care provider to maintain a healthy body weight or to lose weight. Ask what an ideal weight is for you.  Get at least 30 minutes of exercise that causes your heart to beat faster (aerobic exercise) most days of the week. Activities may include walking, swimming, or biking.  Work with your health care provider or diet and nutrition specialist (dietitian) to adjust your eating plan to your individual calorie needs. Reading food labels  Check food labels for the amount of sodium per serving. Choose foods with less than 5 percent of the Daily Value of sodium. Generally, foods with less than 300 mg of sodium per serving fit into this eating plan.  To find whole grains, look for the word "whole" as the first word in the ingredient list. Shopping  Buy products labeled as "low-sodium" or "no salt added."  Buy fresh foods. Avoid canned foods and premade or frozen meals. Cooking  Avoid adding salt when cooking. Use salt-free seasonings or herbs instead of table salt or sea salt. Check with your health care provider or pharmacist before using salt substitutes.  Do not fry foods. Cook foods using healthy methods such as baking, boiling, grilling, and broiling  instead.  Cook with heart-healthy oils, such as olive, canola, soybean, or sunflower oil. Meal planning   Eat a balanced diet that includes: ? 5 or more servings of fruits and vegetables each day. At each meal, try to fill half of your plate with fruits and vegetables. ? Up to 6-8 servings of whole grains each day. ? Less than 6 oz of lean meat, poultry, or fish each day. A 3-oz serving of meat is about the same size as a deck of cards. One egg equals 1 oz. ? 2 servings of low-fat dairy each day. ? A serving of nuts, seeds, or beans 5 times each week. ? Heart-healthy fats. Healthy fats called Omega-3 fatty acids are found in foods such as flaxseeds and coldwater fish, like sardines, salmon, and mackerel.  Limit how much you eat of the following: ? Canned or prepackaged foods. ? Food that is high in trans fat, such as fried foods. ? Food that is high in saturated fat, such as fatty meat. ? Sweets, desserts, sugary drinks, and other foods with added sugar. ? Full-fat dairy products.  Do not salt foods before eating.  Try to eat at least 2 vegetarian meals each week.  Eat more home-cooked food and less restaurant, buffet, and fast food.  When eating at a restaurant, ask that your food be prepared with less salt or no salt, if possible. What foods are recommended? The items listed may not be a complete list. Talk with  your dietitian about what dietary choices are best for you. Grains Whole-grain or whole-wheat bread. Whole-grain or whole-wheat pasta. Brown rice. [place]. Bulgur. Whole-grain and low-sodium cereals. Pita bread. Low-fat, low-sodium crackers. Whole-wheat flour tortillas. Vegetables Fresh or frozen vegetables (raw, steamed, roasted, or grilled). Low-sodium or reduced-sodium tomato and vegetable juice. Low-sodium or reduced-sodium tomato sauce and tomato paste. Low-sodium or reduced-sodium canned vegetables. Fruits All fresh, dried, or frozen fruit. Canned fruit in  natural juice (without added sugar). Meat and other protein foods Skinless chicken or [place]. Ground chicken or [place]. Pork with fat trimmed off. Fish and seafood. Egg whites. Dried beans, peas, or lentils. Unsalted nuts, nut butters, and seeds. Unsalted canned beans. Lean cuts of beef with fat trimmed off. Low-sodium, lean deli meat. Dairy Low-fat (1%) or fat-free (skim) milk. Fat-free, low-fat, or reduced-fat cheeses. Nonfat, low-sodium ricotta or cottage cheese. Low-fat or nonfat yogurt. Low-fat, low-sodium cheese. Fats and oils Soft margarine without trans fats. Vegetable oil. Low-fat, reduced-fat, or light mayonnaise and salad dressings (reduced-sodium). Canola, safflower, olive, soybean, and sunflower oils. Avocado. Seasoning and other foods Herbs. Spices. Seasoning mixes without salt. Unsalted popcorn and pretzels. Fat-free sweets. What foods are not recommended? The items listed may not be a complete list. Talk with your dietitian about what dietary choices are best for you. Grains Baked goods made with fat, such as croissants, muffins, or some breads. Dry pasta or rice meal packs. Vegetables Creamed or fried vegetables. Vegetables in a cheese sauce. Regular canned vegetables (not low-sodium or reduced-sodium). Regular canned tomato sauce and paste (not low-sodium or reduced-sodium). Regular tomato and vegetable juice (not low-sodium or reduced-sodium). Angie Fava. Olives. Fruits Canned fruit in a light or heavy syrup. Fried fruit. Fruit in cream or butter sauce. Meat and other protein foods Fatty cuts of meat. Ribs. Fried meat. Berniece Salines. Sausage. Bologna and other processed lunch meats. Salami. Fatback. Hotdogs. Bratwurst. Salted nuts and seeds. Canned beans with added salt. Canned or smoked fish. Whole eggs or egg yolks. Chicken or [place] with skin. Dairy Whole or 2% milk, cream, and half-and-half. Whole or full-fat cream cheese. Whole-fat or sweetened yogurt. Full-fat cheese. Nondairy  creamers. Whipped toppings. Processed cheese and cheese spreads. Fats and oils Butter. Stick margarine. Lard. Shortening. Ghee. Bacon fat. Tropical oils, such as coconut, palm kernel, or palm oil. Seasoning and other foods Salted popcorn and pretzels. Onion salt, garlic salt, seasoned salt, table salt, and sea salt. Worcestershire sauce. Tartar sauce. Barbecue sauce. Teriyaki sauce. Soy sauce, including reduced-sodium. Steak sauce. Canned and packaged gravies. Fish sauce. Oyster sauce. Cocktail sauce. Horseradish that you find on the shelf. Ketchup. Mustard. Meat flavorings and tenderizers. Bouillon cubes. Hot sauce and Tabasco sauce. Premade or packaged marinades. Premade or packaged taco seasonings. Relishes. Regular salad dressings. Where to find more information:  National Heart, Lung, and [place]: https://wilson-eaton.com/  American Heart Association: www.heart.org Summary  The DASH eating plan is a healthy eating plan that has been shown to reduce high blood pressure (hypertension). It may also reduce your risk for type 2 diabetes, heart disease, and stroke.  With the DASH eating plan, you should limit salt (sodium) intake to 2,300 mg a day. If you have hypertension, you may need to reduce your sodium intake to 1,500 mg a day.  When on the DASH eating plan, aim to eat more fresh fruits and vegetables, whole grains, lean proteins, low-fat dairy, and heart-healthy fats.  Work with your health care provider or diet and nutrition specialist (dietitian) to adjust your eating  plan to your individual calorie needs. This information is not intended to replace advice given to you by your health care provider. Make sure you discuss any questions you have with your health care provider. Document Released: 12/06/2011 Document Revised: 12/10/2016 Document Reviewed: 12/10/2016 Elsevier Interactive Patient Education  [employer].

## 2018-07-07 NOTE — Assessment & Plan Note (Addendum)
Presented to ED with atypical chest pain and his work up was negative for cardiac cause, he believes it has been better since then. Avoid offending foods, start probiotics. Do not eat large meals in late evening and consider raising head of bed.

## 2018-07-07 NOTE — Progress Notes (Signed)
Subjective:  I acted as a Education administrator for Dr. Charlett Blake. [place], [place]  Patient ID: Steven Powell, male    DOB: 04-20-1981, 37 y.o.   MRN: [id number]  No chief complaint on file.   HPI  Patient is in today for 6 month follow up and he has had a frustrating couple of months. He had a bad shingles outbreak in May but has recovered fully. Also had a shoulder injury which still causes some discomfort but is improving. No radicular symptoms. He had a trip to the ER with atypical chest pain and the cardiac portion of the work up was negative. He struggles with dyspepsia and feels these symptoms are worse after eating. He has not had a recurrence of severe pain since the visit. Had some nausea but no vomiting. No diaphoresis or palpitations.   Patient Care Team: Mosie Lukes, MD as PCP - General (Family Medicine)   Past Medical History:  Diagnosis Date  . Allergy    seasonal mostly in spring  . Asthma    childhood  . Diabetes mellitus type 2, controlled ([facility]) 10/01/2017  . Gout    left knee  . Hyperlipidemia   . Hyperlipidemia associated with type 2 diabetes mellitus ([facility]) 10/06/2017  . Preventative health care 10/06/2017  . Pseudogout    left knee    Past Surgical History:  Procedure Laterality Date  . KNEE SURGERY    . KNEE SURGERY  2014   arthroscopy left knee, found pseudogout, and meniscal tea    Family History  Problem Relation Age of Onset  . Diabetes Unknown   . Hyperlipidemia Unknown   . Hypertension Unknown   . Hypertension Mother   . Diabetes Father   . Heart disease Father   . Asthma Daughter   . Stroke Maternal Grandmother   . Stroke Maternal Grandfather     Social History   Socioeconomic History  . Marital status: Single    Spouse name: Not on file  . Number of children: Not on file  . Years of education: Not on file  . Highest education level: Not on file  Occupational History  . Not on file  Social Needs  . Financial resource strain: Not on file  .  Food insecurity:    Worry: Not on file    Inability: Not on file  . Transportation needs:    Medical: Not on file    Non-medical: Not on file  Tobacco Use  . Smoking status: Never Smoker  . Smokeless tobacco: Never Used  Substance and Sexual Activity  . Alcohol use: Yes    Alcohol/week: 0.0 oz  . Drug use: No  . Sexual activity: Not on file  Lifestyle  . Physical activity:    Days per week: Not on file    Minutes per session: Not on file  . Stress: Not on file  Relationships  . Social connections:    Talks on phone: Not on file    Gets together: Not on file    Attends religious service: Not on file    Active member of club or organization: Not on file    Attends meetings of clubs or organizations: Not on file    Relationship status: Not on file  . Intimate partner violence:    Fear of current or ex partner: Not on file    Emotionally abused: Not on file    Physically abused: Not on file    Forced sexual activity: Not on file  Other Topics Concern  . Not on file  Social History Narrative   Occupation: currently working for [employer]   Lives with wife and mom   Never Smoked    Alcohol use-yes (social)   No dietary restrictions, intermittent fasting           Outpatient Medications Prior to Visit  Medication Sig Dispense Refill  . colchicine 0.6 MG tablet Take 1 tablet (0.6 mg total) by mouth daily. 90 tablet 1  . ibuprofen (ADVIL,MOTRIN) 200 MG tablet Take 1-2 tablets (200-400 mg total) by mouth every 6 (six) hours as needed. 30 tablet 0  . meloxicam (MOBIC) 15 MG tablet Take 15 mg by mouth daily as needed for pain.  1  . allopurinol (ZYLOPRIM) 100 MG tablet TAKE 1 TABLET BY MOUTH EVERY DAY 30 tablet 2   No facility-administered medications prior to visit.     No Known Allergies  Review of Systems  Constitutional: Negative for fever and malaise/fatigue.  HENT: Negative for congestion.   Eyes: Negative for blurred vision.  Respiratory: Negative for shortness of  breath.   Cardiovascular: Positive for chest pain. Negative for palpitations and leg swelling.  Gastrointestinal: Positive for heartburn and nausea. Negative for abdominal pain and blood in stool.  Genitourinary: Negative for dysuria and frequency.  Musculoskeletal: Negative for falls.  Skin: Positive for rash.  Neurological: Negative for dizziness, loss of consciousness and headaches.  Endo/Heme/Allergies: Negative for environmental allergies.  Psychiatric/Behavioral: Negative for depression. The patient is not nervous/anxious.        Objective:    Physical Exam  Constitutional: He is oriented to person, place, and time. He appears well-developed and well-nourished. No distress.  HENT:  Head: Normocephalic and atraumatic.  Nose: Nose normal.  Eyes: Right eye exhibits no discharge. Left eye exhibits no discharge.  Neck: Normal range of motion. Neck supple.  Cardiovascular: Normal rate and regular rhythm.  No murmur heard. Pulmonary/Chest: Effort normal and breath sounds normal.  Abdominal: Soft. Bowel sounds are normal. There is no tenderness.  Musculoskeletal: He exhibits no edema.  Neurological: He is alert and oriented to person, place, and time.  Skin: Skin is warm and dry.  Psychiatric: He has a normal mood and affect.  Nursing note and vitals reviewed.   BP 119/84 (BP Location: Left Arm, Patient Position: Sitting, Cuff Size: Normal)   Pulse 90   Temp 98 F (36.7 C) (Oral)   Resp 18   Ht 5\' 4"  (1.626 m)   Wt 202 lb 3.2 oz (91.7 kg)   SpO2 98%   BMI 34.71 kg/m  Wt Readings from Last 3 Encounters:  07/07/18 202 lb 3.2 oz (91.7 kg)  05/12/18 197 lb 12.8 oz (89.7 kg)  05/07/18 193 lb 12.8 oz (87.9 kg)   BP Readings from Last 3 Encounters:  07/07/18 119/84  06/15/18 (!) 165/105  05/12/18 124/82      There is no immunization history on file for this patient.  Health Maintenance  Topic Date Due  . PNEUMOCOCCAL POLYSACCHARIDE VACCINE (1) 04/17/1985  . FOOT EXAM   04/17/1993  . OPHTHALMOLOGY EXAM  04/17/1993  . URINE MICROALBUMIN  04/17/1993  . HIV Screening  04/17/1998  . TETANUS/TDAP  04/17/2002  . HEMOGLOBIN A1C  07/02/2018  . INFLUENZA VACCINE  Discontinued    Lab Results  Component Value Date   WBC 7.1 06/15/2018   HGB 15.5 06/15/2018   HCT 47.7 06/15/2018   PLT 283 06/15/2018   GLUCOSE 109 (H) 06/15/2018  CHOL 295 (H) 01/02/2018   TRIG 168.0 (H) 01/02/2018   HDL 50.80 01/02/2018   LDLDIRECT 161.4 03/02/2008   LDLCALC 211 (H) 01/02/2018   ALT 80 (H) 01/02/2018   AST 36 01/02/2018   NA 138 06/15/2018   K 3.6 06/15/2018   CL 104 06/15/2018   CREATININE 1.21 06/15/2018   BUN 12 06/15/2018   CO2 25 06/15/2018   TSH 2.89 01/02/2018   HGBA1C 6.4 01/02/2018    Lab Results  Component Value Date   TSH 2.89 01/02/2018   Lab Results  Component Value Date   WBC 7.1 06/15/2018   HGB 15.5 06/15/2018   HCT 47.7 06/15/2018   MCV 91.6 06/15/2018   PLT 283 06/15/2018   Lab Results  Component Value Date   NA 138 06/15/2018   K 3.6 06/15/2018   CO2 25 06/15/2018   GLUCOSE 109 (H) 06/15/2018   BUN 12 06/15/2018   CREATININE 1.21 06/15/2018   BILITOT 2.0 (H) 01/02/2018   ALKPHOS 62 01/02/2018   AST 36 01/02/2018   ALT 80 (H) 01/02/2018   PROT 8.0 01/02/2018   ALBUMIN 4.9 01/02/2018   CALCIUM 9.5 06/15/2018   ANIONGAP 9 06/15/2018   GFR 72.65 01/02/2018   Lab Results  Component Value Date   CHOL 295 (H) 01/02/2018   Lab Results  Component Value Date   HDL 50.80 01/02/2018   Lab Results  Component Value Date   LDLCALC 211 (H) 01/02/2018   Lab Results  Component Value Date   TRIG 168.0 (H) 01/02/2018   Lab Results  Component Value Date   CHOLHDL 6 01/02/2018   Lab Results  Component Value Date   HGBA1C 6.4 01/02/2018         Assessment & Plan:   Problem List Items Addressed This Visit    Gout - Primary   Relevant Orders   Uric acid   Diabetes mellitus type 2, controlled ([facility])    hgba1c acceptable,  minimize simple carbs. Increase exercise as tolerated. Continue current meds      Relevant Orders   Hemoglobin A1c   Comprehensive metabolic panel   TSH   Hyperlipidemia associated with type 2 diabetes mellitus ([facility])    Encouraged heart healthy diet, increase exercise, avoid trans fats, consider a krill oil cap daily      Relevant Orders   Lipid panel   TSH   Right shoulder pain    He struggles with an impingement and has been seen by Dr Barbaraann Barthel. Did some exercises but was not helpful. He had a flare this spring with decreased ROM so he saw Raliegh Ip and MRI just confirmed impingement. Is now doing exercises again and his ROM is improving.       Obesity (BMI 30-39.9)    Encouraged DASH or MIND diet, decrease po intake and increase exercise as tolerated. Needs 7-8 hours of sleep nightly. Avoid trans fats, eat small, frequent meals every 4-5 hours with lean proteins, complex carbs and healthy fats. Minimize simple carbs. Was doing intermittent fasting until he injured his shoulder and was loosing weight.      Relevant Orders   TSH   Acid reflux    Presented to ED with atypical chest pain and his work up was negative for cardiac cause, he believes it has been better since then. Avoid offending foods, start probiotics. Do not eat large meals in late evening and consider raising head of bed.       Relevant Orders  CBC   Comprehensive metabolic panel   TSH      I have changed Sterling Big Gosdin's allopurinol. I am also having him maintain his ibuprofen, colchicine, and meloxicam.  Meds ordered this encounter  Medications  . allopurinol (ZYLOPRIM) 100 MG tablet    Sig: Take 1 tablet (100 mg total) by mouth daily.    Dispense:  90 tablet    Refill:  1    CMA served as Education administrator during this visit. History, Physical and Plan performed by medical provider. Documentation and orders reviewed and attested to.  Penni Homans, MD

## 2018-07-07 NOTE — Assessment & Plan Note (Signed)
He struggles with an impingement and has been seen by Dr Barbaraann Barthel. Did some exercises but was not helpful. He had a flare this spring with decreased ROM so he saw Raliegh Ip and MRI just confirmed impingement. Is now doing exercises again and his ROM is improving.

## 2018-07-07 NOTE — Assessment & Plan Note (Signed)
hgba1c acceptable, minimize simple carbs. Increase exercise as tolerated. Continue current meds 

## 2018-07-07 NOTE — Assessment & Plan Note (Signed)
Encouraged heart healthy diet, increase exercise, avoid trans fats, consider a krill oil cap daily 

## 2018-10-20 ENCOUNTER — Other Ambulatory Visit (INDEPENDENT_AMBULATORY_CARE_PROVIDER_SITE_OTHER): Payer: 59

## 2018-10-20 DIAGNOSIS — E669 Obesity, unspecified: Secondary | ICD-10-CM | POA: Diagnosis not present

## 2018-10-20 DIAGNOSIS — E113531 Type 2 diabetes mellitus with proliferative diabetic retinopathy with traction retinal detachment not involving the macula, right eye: Secondary | ICD-10-CM

## 2018-10-20 DIAGNOSIS — E1169 Type 2 diabetes mellitus with other specified complication: Secondary | ICD-10-CM

## 2018-10-20 DIAGNOSIS — K219 Gastro-esophageal reflux disease without esophagitis: Secondary | ICD-10-CM | POA: Diagnosis not present

## 2018-10-20 DIAGNOSIS — E785 Hyperlipidemia, unspecified: Secondary | ICD-10-CM

## 2018-10-20 DIAGNOSIS — M109 Gout, unspecified: Secondary | ICD-10-CM | POA: Diagnosis not present

## 2018-10-20 LAB — LIPID PANEL
CHOLESTEROL: 236 mg/dL — AB (ref 0–200)
HDL: 39.5 mg/dL (ref >39.00)
NonHDL: 196.35
Total CHOL/HDL Ratio: 6
Triglycerides: 258 mg/dL — ABNORMAL HIGH (ref 0.0–149.0)
VLDL: 51.6 mg/dL — AB (ref 0.0–40.0)

## 2018-10-20 LAB — CBC
HCT: 46.3 % (ref 39.0–52.0)
Hemoglobin: 15.5 g/dL (ref 13.0–17.0)
MCHC: 33.6 g/dL (ref 30.0–36.0)
MCV: 91.3 fl (ref 78.0–100.0)
Platelets: 322 10*3/uL (ref 150.0–400.0)
RBC: 5.07 Mil/uL (ref 4.22–5.81)
RDW: 13.3 % (ref 11.5–15.5)
WBC: 11.3 10*3/uL — AB (ref 4.0–10.5)

## 2018-10-20 LAB — COMPREHENSIVE METABOLIC PANEL
ALBUMIN: 4.5 g/dL (ref 3.5–5.2)
ALK PHOS: 78 U/L (ref 39–117)
ALT: 61 U/L — ABNORMAL HIGH (ref 0–53)
AST: 23 U/L (ref 0–37)
BUN: 13 mg/dL (ref 6–23)
CALCIUM: 9.4 mg/dL (ref 8.4–10.5)
CHLORIDE: 105 meq/L (ref 96–112)
CO2: 26 mEq/L (ref 19–32)
Creatinine, Ser: 1.36 mg/dL (ref 0.40–1.50)
GFR: 63.2 mL/min (ref >60.00)
Glucose, Bld: 107 mg/dL — ABNORMAL HIGH (ref 70–99)
Potassium: 4.2 mEq/L (ref 3.5–5.1)
SODIUM: 140 meq/L (ref 135–145)
TOTAL PROTEIN: 7.3 g/dL (ref 6.0–8.3)
Total Bilirubin: 1.2 mg/dL (ref 0.2–1.2)

## 2018-10-20 LAB — TSH: TSH: 2.39 u[IU]/mL (ref 0.35–4.50)

## 2018-10-20 LAB — URIC ACID: URIC ACID, SERUM: 9.1 mg/dL — AB (ref 4.0–7.8)

## 2018-10-20 LAB — LDL CHOLESTEROL, DIRECT: Direct LDL: 185 mg/dL

## 2018-10-20 LAB — HEMOGLOBIN A1C: Hgb A1c MFr Bld: 6 % (ref 4.6–6.5)

## 2018-10-24 ENCOUNTER — Ambulatory Visit (INDEPENDENT_AMBULATORY_CARE_PROVIDER_SITE_OTHER): Payer: 59 | Admitting: Family Medicine

## 2018-10-24 ENCOUNTER — Encounter: Payer: Self-pay | Admitting: Family Medicine

## 2018-10-24 VITALS — BP 112/82 | HR 76 | Temp 98.1°F | Resp 18 | Ht 64.0 in | Wt 195.0 lb

## 2018-10-24 DIAGNOSIS — E113531 Type 2 diabetes mellitus with proliferative diabetic retinopathy with traction retinal detachment not involving the macula, right eye: Secondary | ICD-10-CM

## 2018-10-24 DIAGNOSIS — E785 Hyperlipidemia, unspecified: Secondary | ICD-10-CM

## 2018-10-24 DIAGNOSIS — E669 Obesity, unspecified: Secondary | ICD-10-CM

## 2018-10-24 DIAGNOSIS — E1169 Type 2 diabetes mellitus with other specified complication: Secondary | ICD-10-CM

## 2018-10-24 DIAGNOSIS — M1000 Idiopathic gout, unspecified site: Secondary | ICD-10-CM

## 2018-10-24 DIAGNOSIS — D72829 Elevated white blood cell count, unspecified: Secondary | ICD-10-CM | POA: Diagnosis not present

## 2018-10-24 DIAGNOSIS — K219 Gastro-esophageal reflux disease without esophagitis: Secondary | ICD-10-CM

## 2018-10-24 LAB — CBC WITH DIFFERENTIAL/PLATELET
BASOS PCT: 0.9 % (ref 0.0–3.0)
Basophils Absolute: 0.1 10*3/uL (ref 0.0–0.1)
EOS PCT: 2.9 % (ref 0.0–5.0)
Eosinophils Absolute: 0.2 10*3/uL (ref 0.0–0.7)
HCT: 46.3 % (ref 39.0–52.0)
Hemoglobin: 15.5 g/dL (ref 13.0–17.0)
LYMPHS ABS: 2.2 10*3/uL (ref 0.7–4.0)
Lymphocytes Relative: 28.9 % (ref 12.0–46.0)
MCHC: 33.4 g/dL (ref 30.0–36.0)
MCV: 91 fl (ref 78.0–100.0)
MONO ABS: 0.7 10*3/uL (ref 0.1–1.0)
MONOS PCT: 9.5 % (ref 3.0–12.0)
Neutro Abs: 4.3 10*3/uL (ref 1.4–7.7)
Neutrophils Relative %: 57.8 % (ref 43.0–77.0)
Platelets: 346 10*3/uL (ref 150.0–400.0)
RBC: 5.09 Mil/uL (ref 4.22–5.81)
RDW: 12.7 % (ref 11.5–15.5)
WBC: 7.5 10*3/uL (ref 4.0–10.5)

## 2018-10-24 NOTE — Progress Notes (Signed)
Subjective:    Patient ID: Steven Powell, male    DOB: [birth year], 37 y.o.   MRN: [id number]  No chief complaint on file.   HPI Patient is in today for follow up. He feels well. No recent febrile illness or hospitalizations. He has been calorie restricting with an APP and feels good about his weight loss. He has had decreased reflux flares since then. He had a left foot gout flare but that has resolved. No polyuria or polydipsia. Denies CP/palp/SOB/HA/congestion/fevers/GI or GU c/o. Taking meds as prescribed  Past Medical History:  Diagnosis Date  . Allergy    seasonal mostly in spring  . Asthma    childhood  . Diabetes mellitus type 2, controlled ([facility]) 10/01/2017  . Gout    left knee  . Hyperlipidemia   . Hyperlipidemia associated with type 2 diabetes mellitus ([facility]) 10/06/2017  . Preventative health care 10/06/2017  . Pseudogout    left knee    Past Surgical History:  Procedure Laterality Date  . KNEE SURGERY    . KNEE SURGERY  2014   arthroscopy left knee, found pseudogout, and meniscal tea    Family History  Problem Relation Age of Onset  . Diabetes Unknown   . Hyperlipidemia Unknown   . Hypertension Unknown   . Hypertension Mother   . Diabetes Father   . Heart disease Father   . Asthma Daughter   . Stroke Maternal Grandmother   . Stroke Maternal Grandfather     Social History   Socioeconomic History  . Marital status: Single    Spouse name: Not on file  . Number of children: Not on file  . Years of education: Not on file  . Highest education level: Not on file  Occupational History  . Not on file  Social Needs  . Financial resource strain: Not on file  . Food insecurity:    Worry: Not on file    Inability: Not on file  . Transportation needs:    Medical: Not on file    Non-medical: Not on file  Tobacco Use  . Smoking status: Never Smoker  . Smokeless tobacco: Never Used  Substance and Sexual Activity  . Alcohol use: Yes    Alcohol/week: 0.0  standard drinks  . Drug use: No  . Sexual activity: Not on file  Lifestyle  . Physical activity:    Days per week: Not on file    Minutes per session: Not on file  . Stress: Not on file  Relationships  . Social connections:    Talks on phone: Not on file    Gets together: Not on file    Attends religious service: Not on file    Active member of club or organization: Not on file    Attends meetings of clubs or organizations: Not on file    Relationship status: Not on file  . Intimate partner violence:    Fear of current or ex partner: Not on file    Emotionally abused: Not on file    Physically abused: Not on file    Forced sexual activity: Not on file  Other Topics Concern  . Not on file  Social History Narrative   Occupation: currently working for [employer]   Lives with wife and mom   Never Smoked    Alcohol use-yes (social)   No dietary restrictions, intermittent fasting           Outpatient Medications Prior to Visit  Medication Sig Dispense Refill  .  allopurinol (ZYLOPRIM) 100 MG tablet Take 1 tablet (100 mg total) by mouth daily. 90 tablet 1  . colchicine 0.6 MG tablet Take 1 tablet (0.6 mg total) by mouth daily. 90 tablet 1  . ibuprofen (ADVIL,MOTRIN) 200 MG tablet Take 1-2 tablets (200-400 mg total) by mouth every 6 (six) hours as needed. 30 tablet 0  . meloxicam (MOBIC) 15 MG tablet Take 15 mg by mouth daily as needed for pain.  1   No facility-administered medications prior to visit.     No Known Allergies  Review of Systems  Constitutional: Negative for fever and malaise/fatigue.  HENT: Negative for congestion.   Eyes: Negative for blurred vision.  Respiratory: Negative for shortness of breath.   Cardiovascular: Negative for chest pain, palpitations and leg swelling.  Gastrointestinal: Negative for abdominal pain, blood in stool and nausea.  Genitourinary: Negative for dysuria and frequency.  Musculoskeletal: Negative for falls.  Skin: Negative for rash.    Neurological: Negative for dizziness, loss of consciousness and headaches.  Endo/Heme/Allergies: Negative for environmental allergies.  Psychiatric/Behavioral: Negative for depression. The patient is not nervous/anxious.        Objective:    Physical Exam  Constitutional: He is oriented to person, place, and time. He appears well-developed and well-nourished. No distress.  HENT:  Head: Normocephalic and atraumatic.  Nose: Nose normal.  Eyes: Right eye exhibits no discharge. Left eye exhibits no discharge.  Neck: Normal range of motion. Neck supple.  Cardiovascular: Normal rate and regular rhythm.  No murmur heard. Pulmonary/Chest: Effort normal and breath sounds normal.  Abdominal: Soft. Bowel sounds are normal. There is no tenderness.  Musculoskeletal: He exhibits no edema.  Neurological: He is alert and oriented to person, place, and time.  Skin: Skin is warm and dry.  Psychiatric: He has a normal mood and affect.  Nursing note and vitals reviewed.   BP 112/82 (BP Location: Left Arm, Patient Position: Sitting, Cuff Size: Normal)   Pulse 76   Temp 98.1 F (36.7 C) (Oral)   Resp 18   Ht 5\' 4"  (1.626 m)   Wt 195 lb (88.5 kg)   SpO2 98%   BMI 33.47 kg/m  Wt Readings from Last 3 Encounters:  10/24/18 195 lb (88.5 kg)  07/07/18 202 lb 3.2 oz (91.7 kg)  05/12/18 197 lb 12.8 oz (89.7 kg)     Lab Results  Component Value Date   WBC 11.3 (H) 10/20/2018   HGB 15.5 10/20/2018   HCT 46.3 10/20/2018   PLT 322.0 10/20/2018   GLUCOSE 107 (H) 10/20/2018   CHOL 236 (H) 10/20/2018   TRIG 258.0 (H) 10/20/2018   HDL 39.50 10/20/2018   LDLDIRECT 185.0 10/20/2018   LDLCALC 211 (H) 01/02/2018   ALT 61 (H) 10/20/2018   AST 23 10/20/2018   NA 140 10/20/2018   K 4.2 10/20/2018   CL 105 10/20/2018   CREATININE 1.36 10/20/2018   BUN 13 10/20/2018   CO2 26 10/20/2018   TSH 2.39 10/20/2018   HGBA1C 6.0 10/20/2018    Lab Results  Component Value Date   TSH 2.39 10/20/2018    Lab Results  Component Value Date   WBC 11.3 (H) 10/20/2018   HGB 15.5 10/20/2018   HCT 46.3 10/20/2018   MCV 91.3 10/20/2018   PLT 322.0 10/20/2018   Lab Results  Component Value Date   NA 140 10/20/2018   K 4.2 10/20/2018   CO2 26 10/20/2018   GLUCOSE 107 (H) 10/20/2018   BUN 13  10/20/2018   CREATININE 1.36 10/20/2018   BILITOT 1.2 10/20/2018   ALKPHOS 78 10/20/2018   AST 23 10/20/2018   ALT 61 (H) 10/20/2018   PROT 7.3 10/20/2018   ALBUMIN 4.5 10/20/2018   CALCIUM 9.4 10/20/2018   ANIONGAP 9 06/15/2018   GFR 63.20 10/20/2018   Lab Results  Component Value Date   CHOL 236 (H) 10/20/2018   Lab Results  Component Value Date   HDL 39.50 10/20/2018   Lab Results  Component Value Date   LDLCALC 211 (H) 01/02/2018   Lab Results  Component Value Date   TRIG 258.0 (H) 10/20/2018   Lab Results  Component Value Date   CHOLHDL 6 10/20/2018   Lab Results  Component Value Date   HGBA1C 6.0 10/20/2018       Assessment & Plan:   Problem List Items Addressed This Visit    Gout    one recent flare. Resolved without intervention      Relevant Orders   Uric acid   Diabetes mellitus type 2, controlled ([facility])    hgba1c acceptable, minimize simple carbs. Increase exercise as tolerated. Continue current meds      Relevant Orders   Hemoglobin A1c   Comprehensive metabolic panel   TSH   Hyperlipidemia associated with type 2 diabetes mellitus ([facility])    Encouraged heart healthy diet, increase exercise, avoid trans fats, consider a krill oil cap daily      Relevant Orders   Lipid panel   TSH   Obesity (BMI 30-39.9)    Good weight loss since last visit. Using a calorie restriction APP      Acid reflux    Improved with weight loss       Other Visit Diagnoses    Leukocytosis, unspecified type    -  Primary   Relevant Orders   CBC w/Diff   CBC with Differential/Platelet      I am having Audrea Muscat maintain his ibuprofen, colchicine, meloxicam, and  allopurinol.  No orders of the defined types were placed in this encounter.     Penni Homans, MD

## 2018-10-24 NOTE — Assessment & Plan Note (Signed)
hgba1c acceptable, minimize simple carbs. Increase exercise as tolerated. Continue current meds 

## 2018-10-24 NOTE — Patient Instructions (Signed)
Flaxseed Oil caps daily  Cholesterol Cholesterol is a white, waxy, fat-like substance that is needed by the human body in small amounts. The liver makes all the cholesterol we need. Cholesterol is carried from the liver by the blood through the blood vessels. Deposits of cholesterol (plaques) may build up on blood vessel (artery) walls. Plaques make the arteries narrower and stiffer. Cholesterol plaques increase the risk for heart attack and stroke. You cannot feel your cholesterol level even if it is very high. The only way to know that it is high is to have a blood test. Once you know your cholesterol levels, you should keep a record of the test results. Work with your health care provider to keep your levels in the desired range. What do the results mean?  Total cholesterol is a rough measure of all the cholesterol in your blood.  LDL (low-density lipoprotein) is the "bad" cholesterol. This is the type that causes plaque to build up on the artery walls. You want this level to be low.  HDL (high-density lipoprotein) is the "good" cholesterol because it cleans the arteries and carries the LDL away. You want this level to be high.  Triglycerides are fat that the body can either burn for energy or store. High levels are closely linked to heart disease. What are the desired levels of cholesterol?  Total cholesterol below 200.  LDL below 100 for people who are at risk, below 70 for people at very high risk.  HDL above 40 is good. A level of 60 or higher is considered to be protective against heart disease.  Triglycerides below 150. How can I lower my cholesterol? Diet Follow your diet program as told by your health care provider.  Choose fish or white meat chicken and [place], roasted or baked. Limit fatty cuts of red meat, fried foods, and processed meats, such as sausage and lunch meats.  Eat lots of fresh fruits and vegetables.  Choose whole grains, beans, pasta, potatoes, and  cereals.  Choose olive oil, corn oil, or canola oil, and use only small amounts.  Avoid butter, mayonnaise, shortening, or palm kernel oils.  Avoid foods with trans fats.  Drink skim or nonfat milk and eat low-fat or nonfat yogurt and cheeses. Avoid whole milk, cream, ice cream, egg yolks, and full-fat cheeses.  Healthier desserts include angel food cake, ginger snaps, animal crackers, hard candy, popsicles, and low-fat or nonfat frozen yogurt. Avoid pastries, cakes, pies, and cookies.  Exercise  Follow your exercise program as told by your health care provider. A regular program: ? Helps to decrease LDL and raise HDL. ? Helps with weight control.  Do things that increase your activity level, such as gardening, walking, and taking the stairs.  Ask your health care provider about ways that you can be more active in your daily life.  Medicine  Take over-the-counter and prescription medicines only as told by your health care provider. ? Medicine may be prescribed by your health care provider to help lower cholesterol and decrease the risk for heart disease. This is usually done if diet and exercise have failed to bring down cholesterol levels. ? If you have several risk factors, you may need medicine even if your levels are normal.  This information is not intended to replace advice given to you by your health care provider. Make sure you discuss any questions you have with your health care provider. Document Released: 09/11/2001 Document Revised: 07/14/2016 Document Reviewed: 06/16/2016 Elsevier Interactive Patient Education  2018 [employer]  

## 2018-10-24 NOTE — Assessment & Plan Note (Signed)
Good weight loss since last visit. Using a calorie restriction APP

## 2018-10-24 NOTE — Assessment & Plan Note (Addendum)
one recent flare. Resolved without intervention

## 2018-10-24 NOTE — Assessment & Plan Note (Signed)
Improved with weight loss 

## 2018-10-24 NOTE — Assessment & Plan Note (Signed)
Encouraged heart healthy diet, increase exercise, avoid trans fats, consider a krill oil cap daily 

## 2019-03-10 ENCOUNTER — Encounter: Payer: Self-pay | Admitting: Family Medicine

## 2019-03-10 ENCOUNTER — Other Ambulatory Visit: Payer: Self-pay | Admitting: Family Medicine

## 2019-03-10 MED ORDER — TRIAMCINOLONE ACETONIDE 0.025 % EX OINT: 1.0000 "application " | TOPICAL_OINTMENT | Freq: Two times a day (BID) | CUTANEOUS | 1 refills | Status: DC

## 2019-03-10 NOTE — Telephone Encounter (Signed)
Please advise 

## 2019-03-17 ENCOUNTER — Encounter: Payer: Self-pay | Admitting: Family Medicine

## 2019-03-18 ENCOUNTER — Other Ambulatory Visit: Payer: Self-pay | Admitting: Family Medicine

## 2019-03-18 MED ORDER — ALBUTEROL SULFATE HFA 108 (90 BASE) MCG/ACT IN AERS: 2.0000 | INHALATION_SPRAY | Freq: Four times a day (QID) | RESPIRATORY_TRACT | 1 refills | Status: DC | PRN

## 2019-04-04 ENCOUNTER — Encounter: Payer: Self-pay | Admitting: Family Medicine

## 2019-04-06 MED ORDER — COLCHICINE 0.6 MG PO TABS: 0.6000 mg | ORAL_TABLET | Freq: Every day | ORAL | 1 refills | Status: DC

## 2019-05-24 ENCOUNTER — Other Ambulatory Visit: Payer: Self-pay | Admitting: Family Medicine

## 2019-05-24 ENCOUNTER — Encounter: Payer: Self-pay | Admitting: Family Medicine

## 2019-05-26 MED ORDER — ALLOPURINOL 100 MG PO TABS: 100.0000 mg | ORAL_TABLET | Freq: Every day | ORAL | 1 refills | Status: DC

## 2019-07-07 ENCOUNTER — Encounter: Payer: Self-pay | Admitting: Internal Medicine

## 2019-07-07 ENCOUNTER — Other Ambulatory Visit: Payer: Self-pay

## 2019-07-07 ENCOUNTER — Ambulatory Visit (INDEPENDENT_AMBULATORY_CARE_PROVIDER_SITE_OTHER): Payer: 59 | Admitting: Internal Medicine

## 2019-07-07 DIAGNOSIS — Z20822 Contact with and (suspected) exposure to covid-19: Secondary | ICD-10-CM

## 2019-07-07 DIAGNOSIS — Z20828 Contact with and (suspected) exposure to other viral communicable diseases: Secondary | ICD-10-CM

## 2019-07-07 NOTE — Progress Notes (Signed)
Subjective:    Patient ID: Steven Powell, male    DOB: 10/11/1981, 38 y.o.   MRN: [id number]  DOS:  07/07/2019 Type of visit - description: Acute visit The patient is concerned about COVID-19 This morning he felt slightly short of breath, he has a history of asthma, use his inhaler once and now feels better but he also noted lack of taste for the last 2 days. 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    Review of Systems Denies fever chills No chest pain but at times during today he had some heavy breathing. No rash No cough or wheezing per se No nausea, vomiting, diarrhea Smell sense is normal. Wife tested negative for coronavirus this week.  Past Medical History:  Diagnosis Date  . Allergy    seasonal mostly in spring  . Asthma    childhood  . Diabetes mellitus type 2, controlled ([place]) 10/01/2017  . Gout    left knee  . Hyperlipidemia   . Hyperlipidemia associated with type 2 diabetes mellitus ([place]) 10/06/2017  . Preventative health care 10/06/2017  . Pseudogout    left knee    Past Surgical History:  Procedure Laterality Date  . KNEE SURGERY    . KNEE SURGERY  2014   arthroscopy left knee, found pseudogout, and meniscal tea    Social History   Socioeconomic History  . Marital status: Single    Spouse name: Not on file  . Number of children: Not on file  . Years of education: Not on file  . Highest education level: Not on file  Occupational History  . Not on file  Social Needs  . Financial resource strain: Not on file  . Food insecurity    Worry: Not on file    Inability: Not on file  . Transportation needs    Medical: Not on file    Non-medical: Not on file  Tobacco Use  . Smoking status: Never Smoker  . Smokeless tobacco: Never Used  Substance and Sexual Activity  . Alcohol use: Yes    Alcohol/week: 0.0 standard drinks  . Drug use: No  . Sexual activity: Not on file  Lifestyle  . Physical activity    Days per week: Not on file   Minutes per session: Not on file  . Stress: Not on file  Relationships  . Social Herbalist on phone: Not on file    Gets together: Not on file    Attends religious service: Not on file    Active member of club or organization: Not on file    Attends meetings of clubs or organizations: Not on file    Relationship status: Not on file  . Intimate partner violence    Fear of current or ex partner: Not on file    Emotionally abused: Not on file    Physically abused: Not on file    Forced sexual activity: Not on file  Other Topics Concern  . Not on file  Social History Narrative   Occupation: currently working for [employer]   Lives with wife and mom   Never Smoked    Alcohol use-yes (social)   No dietary restrictions, intermittent fasting             Allergies as of 07/07/2019   No Known Allergies     Medication List       Accurate as of July 07, 2019  4:33 PM. If you have any  questions, ask your nurse or doctor.        albuterol 108 (90 Base) MCG/ACT inhaler Commonly known as: VENTOLIN HFA Inhale 2 puffs into the lungs every 6 (six) hours as needed for wheezing or shortness of breath.   allopurinol 100 MG tablet Commonly known as: ZYLOPRIM TAKE 1 TABLET BY MOUTH EVERY DAY   allopurinol 100 MG tablet Commonly known as: ZYLOPRIM Take 1 tablet (100 mg total) by mouth daily.   colchicine 0.6 MG tablet Take 1 tablet (0.6 mg total) by mouth daily.   ibuprofen 200 MG tablet Commonly known as: ADVIL Take 1-2 tablets (200-400 mg total) by mouth every 6 (six) hours as needed.   meloxicam 15 MG tablet Commonly known as: MOBIC Take 15 mg by mouth daily as needed for pain.   triamcinolone 0.025 % ointment Commonly known as: KENALOG Apply 1 application topically 2 (two) times daily.           Objective:   Physical Exam There were no vitals taken for this visit. This is a virtual video visit, he is alert oriented x3, no apparent distress, speaking complete  sentences    Assessment    38 year old gentleman, history of asthma, gout, prediabetes presents with COVID 19 rule out/exposure at work The patient had mild shortness of breath and lost of taste but no other symptoms.  Pretest possibility is in the low side. We will send him to be tested for COVID-19. Recommend to closely follow-up precautions His elderly mother lives with him, until the results come back recommend social distancing from her even at home and use a mask when they are in the same room. Red flag symptoms that indicate the need to call or go to the ER discussed with the patient and he verbalized understanding.

## 2019-07-08 ENCOUNTER — Telehealth: Payer: Self-pay | Admitting: *Deleted

## 2019-07-08 DIAGNOSIS — Z20822 Contact with and (suspected) exposure to covid-19: Secondary | ICD-10-CM

## 2019-07-08 NOTE — Telephone Encounter (Signed)
-----   Message from Colon Branch, MD sent at 07/07/2019  4:41 PM EDT ----- Regarding: Please  schedule Covid -19 testing

## 2019-07-08 NOTE — Telephone Encounter (Signed)
Pt scheduled for covid testing 07/09/19 @ 10 @ [facility]. Instructions given and order placed

## 2019-07-09 ENCOUNTER — Other Ambulatory Visit: Payer: Self-pay

## 2019-07-09 DIAGNOSIS — Z20822 Contact with and (suspected) exposure to covid-19: Secondary | ICD-10-CM

## 2019-07-13 LAB — NOVEL CORONAVIRUS, NAA: SARS-CoV-2, NAA: NOT DETECTED

## 2019-07-31 ENCOUNTER — Encounter: Payer: Self-pay | Admitting: Family Medicine

## 2019-10-03 ENCOUNTER — Other Ambulatory Visit: Payer: Self-pay | Admitting: Family Medicine

## 2020-01-19 ENCOUNTER — Other Ambulatory Visit: Payer: Self-pay | Admitting: Family Medicine

## 2020-03-24 ENCOUNTER — Telehealth: Payer: Self-pay | Admitting: Family Medicine

## 2020-03-24 ENCOUNTER — Ambulatory Visit: Payer: Self-pay | Attending: Internal Medicine

## 2020-03-24 DIAGNOSIS — Z23 Encounter for immunization: Secondary | ICD-10-CM

## 2020-03-24 NOTE — Telephone Encounter (Signed)
Pt schedule for cpe on 04-07-2020 with [facility]. Done

## 2020-03-24 NOTE — Progress Notes (Signed)
   Covid-19 Vaccination Clinic  Name:  Aithen Bein    MRN: [id number] DOB: 10-12-1981  03/24/2020  Mr. Osen was observed post Covid-19 immunization for 15 minutes without incident. He was provided with Vaccine Information Sheet and instruction to access the V-Safe system.   Mr. Salib was instructed to call 911 with any severe reactions post vaccine: Marland Kitchen Difficulty breathing  . Swelling of face and throat  . A fast heartbeat  . A bad rash all over body  . Dizziness and weakness   Immunizations Administered    Name Date Dose VIS Date Route   Pfizer COVID-19 Vaccine 03/24/2020 12:03 PM 0.3 mL 12/11/2019 Intramuscular   Manufacturer: [place]   Lot: [id number]   [employer]: [id number]

## 2020-04-03 IMAGING — CR DG CHEST 2V
2 series · 2 of 2 positions shown · non-contrast
Comparison: 08/26/2009

CLINICAL DATA: Chest pain

EXAM:
CHEST - 2 VIEW

[chest pa]
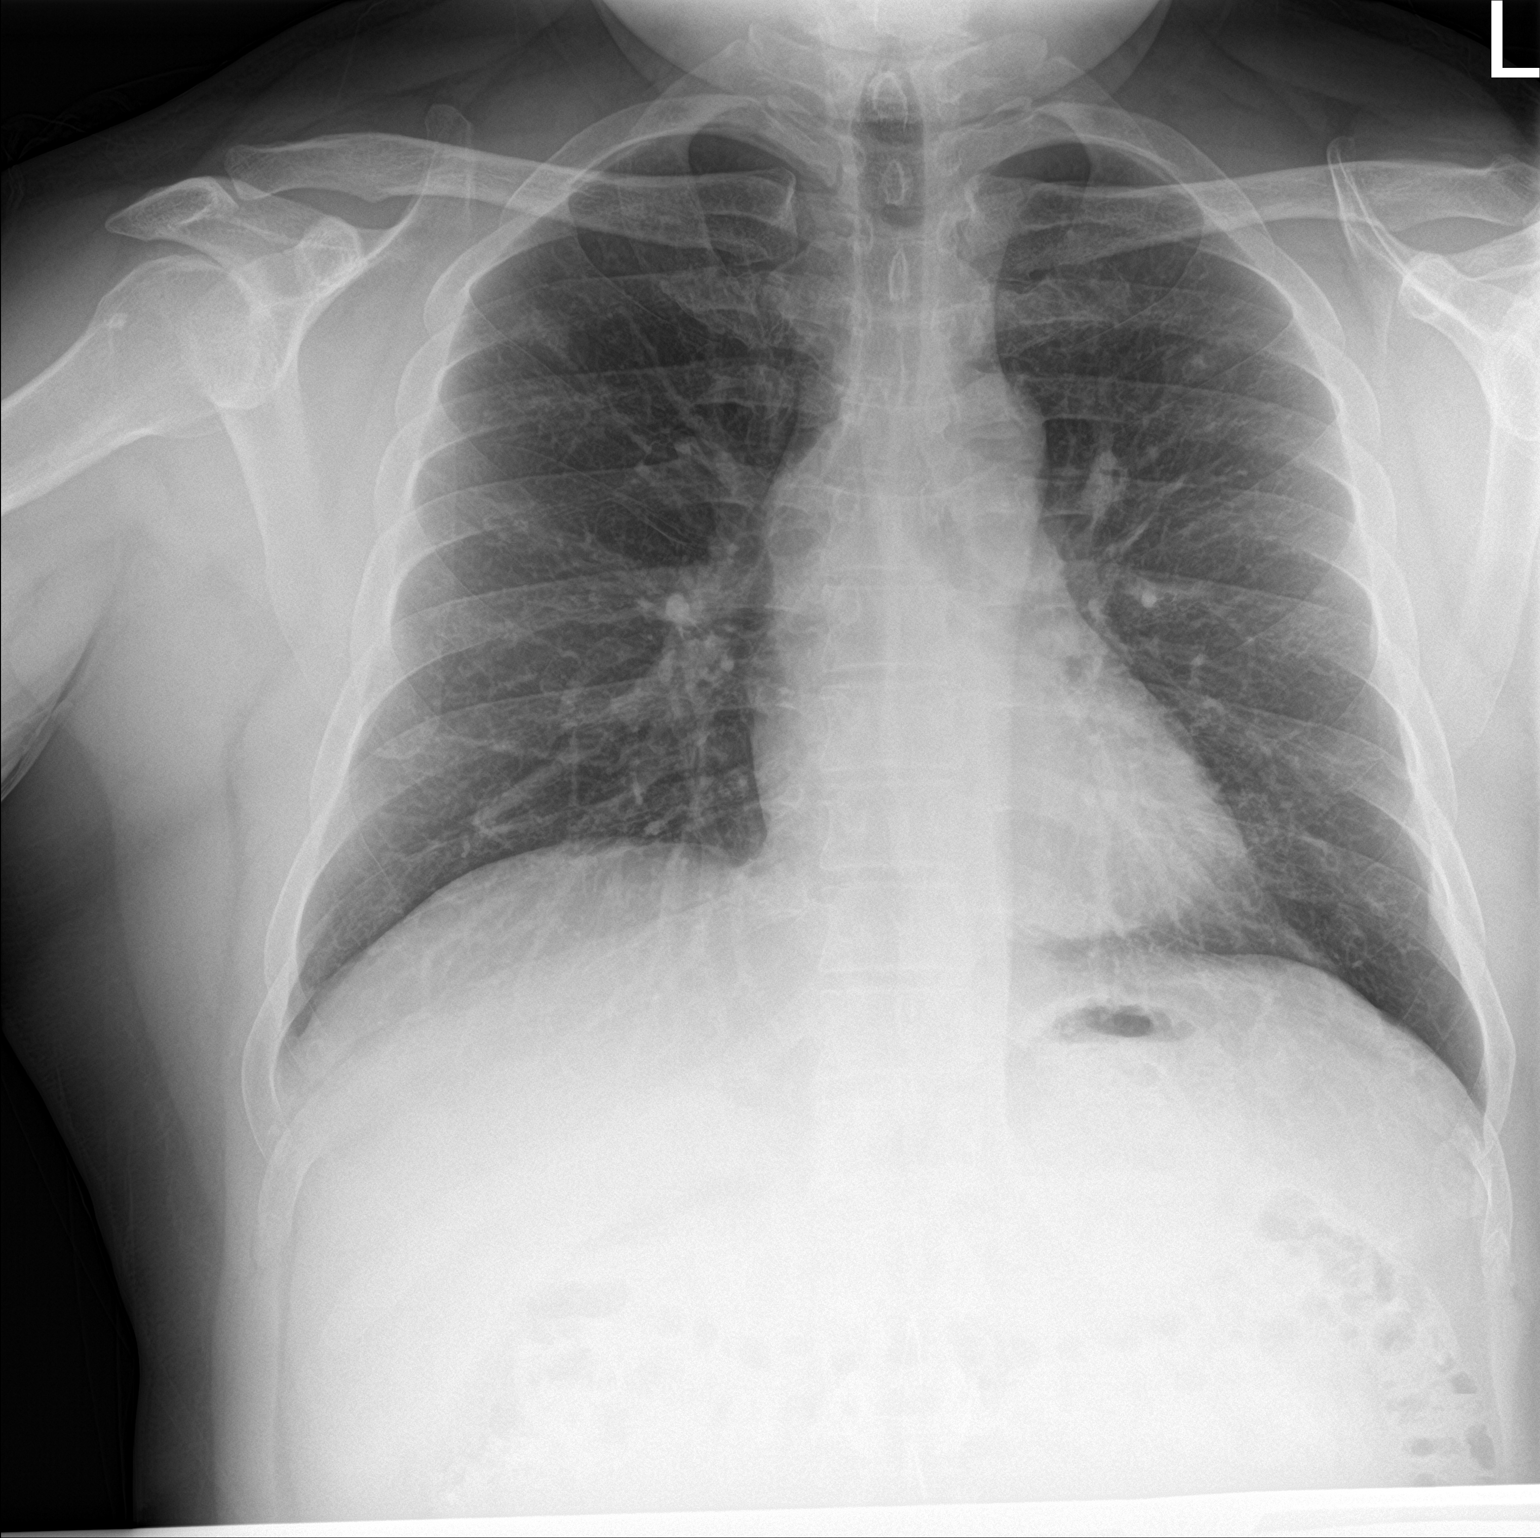

[chest lat]
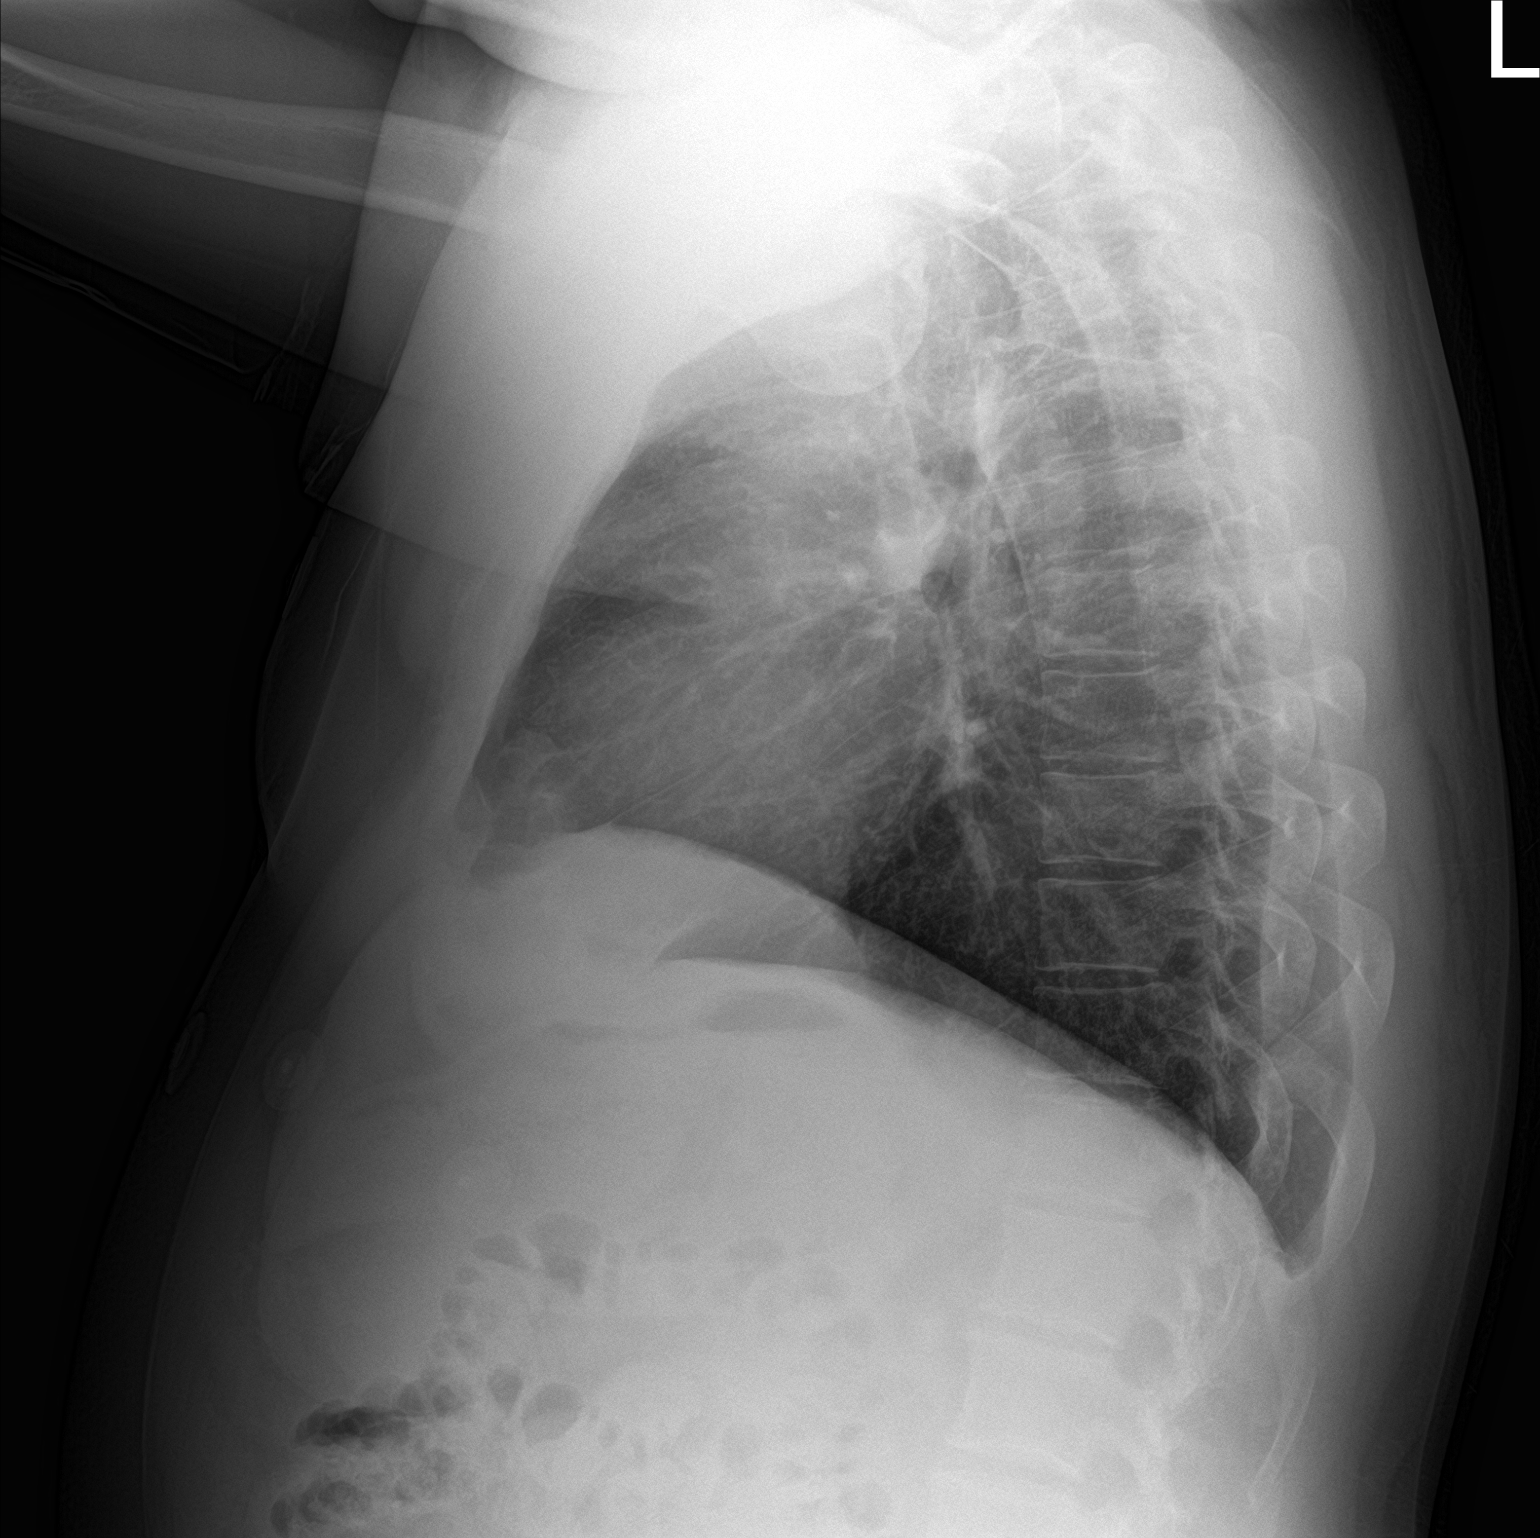

[2 of 2 positions shown; findings below may reference images not displayed]

FINDINGS: Normal mediastinum and cardiac silhouette. Normal pulmonary
vasculature. No evidence of effusion, infiltrate, or pneumothorax.
No acute bony abnormality.
IMPRESSION: No acute cardiopulmonary process.

## 2020-04-06 ENCOUNTER — Other Ambulatory Visit: Payer: Self-pay

## 2020-04-07 ENCOUNTER — Other Ambulatory Visit: Payer: Self-pay

## 2020-04-07 ENCOUNTER — Ambulatory Visit (INDEPENDENT_AMBULATORY_CARE_PROVIDER_SITE_OTHER): Payer: BC Managed Care – PPO | Admitting: Medical

## 2020-04-07 VITALS — BP 125/91 | HR 77 | Temp 98.2°F | Resp 18 | Ht 64.0 in | Wt 196.4 lb

## 2020-04-07 DIAGNOSIS — R739 Hyperglycemia, unspecified: Secondary | ICD-10-CM

## 2020-04-07 DIAGNOSIS — Z113 Encounter for screening for infections with a predominantly sexual mode of transmission: Secondary | ICD-10-CM | POA: Diagnosis not present

## 2020-04-07 DIAGNOSIS — Z23 Encounter for immunization: Secondary | ICD-10-CM

## 2020-04-07 DIAGNOSIS — Z0000 Encounter for general adult medical examination without abnormal findings: Secondary | ICD-10-CM | POA: Diagnosis not present

## 2020-04-07 DIAGNOSIS — J301 Allergic rhinitis due to pollen: Secondary | ICD-10-CM | POA: Diagnosis not present

## 2020-04-07 LAB — LIPID PANEL
Cholesterol: 247 mg/dL — ABNORMAL HIGH (ref 0–200)
HDL: 45.1 mg/dL (ref >39.00)
LDL Cholesterol: 163 mg/dL — ABNORMAL HIGH (ref 0–99)
NonHDL: 202.32
Total CHOL/HDL Ratio: 5
Triglycerides: 197 mg/dL — ABNORMAL HIGH (ref 0.0–149.0)
VLDL: 39.4 mg/dL (ref 0.0–40.0)

## 2020-04-07 LAB — CBC WITH DIFFERENTIAL/PLATELET
Basophils Absolute: 0.1 10*3/uL (ref 0.0–0.1)
Basophils Relative: 1.4 % (ref 0.0–3.0)
Eosinophils Absolute: 0.4 10*3/uL (ref 0.0–0.7)
Eosinophils Relative: 4.6 % (ref 0.0–5.0)
HCT: 44.6 % (ref 39.0–52.0)
Hemoglobin: 15 g/dL (ref 13.0–17.0)
Lymphocytes Relative: 26.3 % (ref 12.0–46.0)
Lymphs Abs: 2 10*3/uL (ref 0.7–4.0)
MCHC: 33.6 g/dL (ref 30.0–36.0)
MCV: 91.4 fl (ref 78.0–100.0)
Monocytes Absolute: 0.7 10*3/uL (ref 0.1–1.0)
Monocytes Relative: 9.7 % (ref 3.0–12.0)
Neutro Abs: 4.4 10*3/uL (ref 1.4–7.7)
Neutrophils Relative %: 58 % (ref 43.0–77.0)
Platelets: 318 10*3/uL (ref 150.0–400.0)
RBC: 4.88 Mil/uL (ref 4.22–5.81)
RDW: 13.6 % (ref 11.5–15.5)
WBC: 7.6 10*3/uL (ref 4.0–10.5)

## 2020-04-07 LAB — COMPREHENSIVE METABOLIC PANEL
ALT: 64 U/L — ABNORMAL HIGH (ref 0–53)
AST: 30 U/L (ref 0–37)
Albumin: 4.6 g/dL (ref 3.5–5.2)
Alkaline Phosphatase: 80 U/L (ref 39–117)
BUN: 9 mg/dL (ref 6–23)
CO2: 27 mEq/L (ref 19–32)
Calcium: 9.3 mg/dL (ref 8.4–10.5)
Chloride: 106 mEq/L (ref 96–112)
Creatinine, Ser: 1.2 mg/dL (ref 0.40–1.50)
GFR: 68.14 mL/min (ref >60.00)
Glucose, Bld: 100 mg/dL — ABNORMAL HIGH (ref 70–99)
Potassium: 4.7 mEq/L (ref 3.5–5.1)
Sodium: 140 mEq/L (ref 135–145)
Total Bilirubin: 1.2 mg/dL (ref 0.2–1.2)
Total Protein: 6.9 g/dL (ref 6.0–8.3)

## 2020-04-07 LAB — HEMOGLOBIN A1C: Hgb A1c MFr Bld: 6.1 % (ref 4.6–6.5)

## 2020-04-07 MED ORDER — FLUTICASONE PROPIONATE 50 MCG/ACT NA SUSP: 2.0000 | Freq: Every day | NASAL | 2 refills | Status: DC

## 2020-04-07 MED ORDER — LEVOCETIRIZINE DIHYDROCHLORIDE 5 MG PO TABS: 5.0000 mg | ORAL_TABLET | Freq: Every evening | ORAL | 3 refills | Status: DC

## 2020-04-07 NOTE — Patient Instructions (Addendum)
For you wellness exam today I have ordered cbc, cmp, a1c and  lipid panel.  Vaccine given today tdap.  Recommend exercise and healthy diet.  We will let you know lab results as they come in.  Follow up date appointment will be determined after lab review.   For allergies rx xyzal and flonase.  For elevated sugar hx got a1c today.

## 2020-04-07 NOTE — Progress Notes (Signed)
Subjective:    Patient ID: Steven Powell, male    DOB: 13-May-1981, 39 y.o.   MRN: [id number]  HPI  Pt in for wellness/cpe.  Pt works [employer]. Pt does not exercise regularly. Pt eating moderate healthy. Non smoker. Rare alcohol use. Coffee every other day. Drinks 1 soda a day.   Pt states recently itchy yes, sneezing and post nasal drainage for 2 days.  Seasonal/spring allergies.  Pt has had one covid vaccine. 2nd one will be on 04/18/2020.  Hx of elevated sugar in past. 2 years ago was 6.7.   Pt will get tdap today.   Review of Systems  Constitutional: Negative for chills, fatigue and fever.  HENT: Positive for congestion, postnasal drip and sneezing.   Eyes: Positive for itching.  Respiratory: Negative for chest tightness, shortness of breath and wheezing.   Cardiovascular: Negative for chest pain and palpitations.  Gastrointestinal: Negative for abdominal pain.  Musculoskeletal: Negative for back pain and myalgias.  Skin: Negative for rash.  Neurological: Negative for dizziness, speech difficulty, weakness and headaches.  Hematological: Negative for adenopathy. Does not bruise/bleed easily.  Psychiatric/Behavioral: Negative for behavioral problems.    Past Medical History:  Diagnosis Date  . Allergy    seasonal mostly in spring  . Asthma    childhood  . Diabetes mellitus type 2, controlled ([place]) 10/01/2017  . Gout    left knee  . Hyperlipidemia   . Hyperlipidemia associated with type 2 diabetes mellitus ([place]) 10/06/2017  . Preventative health care 10/06/2017  . Pseudogout    left knee     Social History   Socioeconomic History  . Marital status: Married    Spouse name: Not on file  . Number of children: Not on file  . Years of education: Not on file  . Highest education level: Not on file  Occupational History  . Not on file  Tobacco Use  . Smoking status: Never Smoker  . Smokeless tobacco: Never Used  Substance and Sexual Activity  . Alcohol use: Yes      Alcohol/week: 0.0 standard drinks  . Drug use: No  . Sexual activity: Not on file  Other Topics Concern  . Not on file  Social History Narrative   Occupation: currently working for [employer]   Lives with wife and mom   Never Smoked    Alcohol use-yes (social)   No dietary restrictions, intermittent fasting          Social Determinants of Health   Financial Resource Strain:   . Difficulty of Paying Living Expenses:   Food Insecurity:   . Worried About Charity fundraiser in the Last Year:   . Arboriculturist in the Last Year:   Transportation Needs:   . Film/video editor (Medical):   Marland Kitchen Lack of Transportation (Non-Medical):   Physical Activity:   . Days of Exercise per Week:   . Minutes of Exercise per Session:   Stress:   . Feeling of Stress :   Social Connections:   . Frequency of Communication with Friends and Family:   . Frequency of Social Gatherings with Friends and Family:   . Attends Religious Services:   . Active Member of Clubs or Organizations:   . Attends Archivist Meetings:   Marland Kitchen Marital Status:   Intimate Partner Violence:   . Fear of Current or Ex-Partner:   . Emotionally Abused:   Marland Kitchen Physically Abused:   . Sexually Abused:  Past Surgical History:  Procedure Laterality Date  . KNEE SURGERY    . KNEE SURGERY  2014   arthroscopy left knee, found pseudogout, and meniscal tea    Family History  Problem Relation Age of Onset  . Diabetes Other   . Hyperlipidemia Other   . Hypertension Other   . Hypertension Mother   . Diabetes Father   . Heart disease Father   . Asthma Daughter   . Stroke Maternal Grandmother   . Stroke Maternal Grandfather     No Known Allergies  Current Outpatient Medications on File Prior to Visit  Medication Sig Dispense Refill  . albuterol (PROVENTIL HFA;VENTOLIN HFA) 108 (90 Base) MCG/ACT inhaler Inhale 2 puffs into the lungs every 6 (six) hours as needed for wheezing or shortness of breath. 1 Inhaler 1  .  allopurinol (ZYLOPRIM) 100 MG tablet TAKE 1 TABLET BY MOUTH EVERY DAY 90 tablet 1  . allopurinol (ZYLOPRIM) 100 MG tablet TAKE 1 TABLET BY MOUTH EVERY DAY 90 tablet 1  . colchicine 0.6 MG tablet TAKE 1 TABLET BY MOUTH EVERY DAY 90 tablet 1  . ibuprofen (ADVIL,MOTRIN) 200 MG tablet Take 1-2 tablets (200-400 mg total) by mouth every 6 (six) hours as needed. 30 tablet 0  . meloxicam (MOBIC) 15 MG tablet Take 15 mg by mouth daily as needed for pain.  1  . triamcinolone (KENALOG) 0.025 % ointment Apply 1 application topically 2 (two) times daily. 80 g 1   No current facility-administered medications on file prior to visit.    BP (!) 125/91 (BP Location: Right Arm, Patient Position: Sitting, Cuff Size: Large)   Pulse 77   Temp 98.2 F (36.8 C) (Temporal)   Resp 18   Ht 5\' 4"  (1.626 m)   Wt 196 lb 6.4 oz (89.1 kg)   SpO2 96%   BMI 33.71 kg/m       Objective:   Physical Exam  General   Mental Status- Alert. General Appearance- Not in acute distress.   Skin General: Color- Normal Color. Moisture- Normal Moisture.  Neck Carotid Arteries- Normal color. Moisture- Normal Moisture. No carotid bruits. No JVD.  Chest and Lung Exam Auscultation: Breath Sounds:-Normal.  Cardiovascular Auscultation:Rythm- Regular. Murmurs & Other Heart Sounds:Auscultation of the heart reveals- No Murmurs.  Abdomen Inspection:-Inspeection Normal. Palpation/Percussion:Note:No mass. Palpation and Percussion of the abdomen reveal- Non Tender, Non Distended + BS, no rebound or guarding.    Neurologic Cranial Nerve exam:- CN III-XII intact(No nystagmus), symmetric smile. Strength:- 5/5 equal and symmetric strength both upper and lower extremities.  Heent- mild boggy turbinates. +pnd.        Assessment & Plan:  For you wellness exam today I have ordered cbc, cmp, a1c and  lipid panel.  Vaccine given today tdap.  Recommend exercise and healthy diet.  We will let you know lab results as they  come in.  Follow up date appointment will be determined after lab review.  For allergies rx xyzal and flonase.  For elevated sugar hx got a1c today.  Mackie Pai, PA-C

## 2020-04-07 NOTE — Addendum Note (Signed)
Addended by: Jeronimo Greaves on: 04/07/2020 10:31 AM   Modules accepted: Orders

## 2020-04-08 LAB — HIV ANTIBODY (ROUTINE TESTING W REFLEX): HIV 1&2 Ab, 4th Generation: NONREACTIVE

## 2020-04-18 ENCOUNTER — Ambulatory Visit: Payer: BC Managed Care – PPO | Attending: Internal Medicine

## 2020-04-18 DIAGNOSIS — Z23 Encounter for immunization: Secondary | ICD-10-CM

## 2020-04-18 NOTE — Progress Notes (Signed)
   Covid-19 Vaccination Clinic  Name:  Steven Powell    MRN: [id number] DOB: 02-15-1981  04/18/2020  Mr. Steven Powell was observed post Covid-19 immunization for 15 minutes without incident. He was provided with Vaccine Information Sheet and instruction to access the V-Safe system.   Mr. Steven Powell was instructed to call 911 with any severe reactions post vaccine: Marland Kitchen Difficulty breathing  . Swelling of face and throat  . A fast heartbeat  . A bad rash all over body  . Dizziness and weakness   Immunizations Administered    Name Date Dose VIS Date Route   Pfizer COVID-19 Vaccine 04/18/2020 10:54 AM 0.3 mL 02/24/2019 Intramuscular   Manufacturer: [place]   Lot: [id number]   [place]: [id number]

## 2020-04-25 ENCOUNTER — Other Ambulatory Visit: Payer: Self-pay | Admitting: Family Medicine

## 2020-08-25 ENCOUNTER — Ambulatory Visit (INDEPENDENT_AMBULATORY_CARE_PROVIDER_SITE_OTHER): Payer: BC Managed Care – PPO | Admitting: Medical

## 2020-08-25 ENCOUNTER — Other Ambulatory Visit: Payer: Self-pay

## 2020-08-25 VITALS — BP 116/88 | HR 75 | Resp 18 | Ht 64.0 in | Wt 200.8 lb

## 2020-08-25 DIAGNOSIS — B079 Viral wart, unspecified: Secondary | ICD-10-CM

## 2020-08-25 DIAGNOSIS — R21 Rash and other nonspecific skin eruption: Secondary | ICD-10-CM | POA: Diagnosis not present

## 2020-08-25 MED ORDER — NYSTATIN 100000 UNIT/GM EX CREA: 1.0000 "application " | TOPICAL_CREAM | Freq: Two times a day (BID) | CUTANEOUS | 0 refills | Status: DC

## 2020-08-25 NOTE — Patient Instructions (Signed)
Appears you have wart on rt forearm. Recommend that you try cryofreeze and compound w treatment as explained.   For face rash rx nystatin and apply twice daily. Keep self shaved.  Refer to dermatologist.  Follow up 6 weeks or sooner

## 2020-08-25 NOTE — Progress Notes (Signed)
   Subjective:    Patient ID: Steven Powell, male    DOB: 07-03-1981, 39 y.o.   MRN: [id number]  HPI  Pt has 2 lesions on rt forearm.  Pt thinks it is a wart and states he had area frozen and it did resolve. He estimates had wart 5 years ago or so. Now past 2 months states reoccuring. Also very tiny early possible wart adjacent to area.  Pt also has some red rash to his face. 2 years of rash. Failed treatment with triamcinolone. He wears mask all the time. He wants referral to derm      Review of Systems     Objective:   Physical Exam  General- No acute distress. Pleasant patient. Neck- Full range of motion, no jvd Lungs- Clear, even and unlabored. Heart- regular rate and rhythm. Neurologic- CNII- XII grossly intact.  Rt forearm- mid aspect appears to have 6 mm wide wart. Just proximal up arm tiny wart        Assessment & Plan:  Appears you have wart on rt forearm. Recommend that you try cryofreeze and compound w treatment as explained.   For face rash rx nystatin and apply twice daily. Keep self shaved.  Refer to dermatologist.  Follow up 6 weeks or sooner  Note was going to cryofreeze pt warts. Then decided against since he wanted referral to dermatolgist for face rash. I tried to delete/take out charge capture cryfreeze.  Time spent with patient today was 25 minutes which consisted of chart revdiew, discussing diagnosis, work up treatment and documentation.

## 2020-09-22 ENCOUNTER — Ambulatory Visit (INDEPENDENT_AMBULATORY_CARE_PROVIDER_SITE_OTHER): Payer: BC Managed Care – PPO | Admitting: Medical

## 2020-09-22 ENCOUNTER — Other Ambulatory Visit: Payer: Self-pay

## 2020-09-22 VITALS — BP 120/80 | HR 87 | Temp 98.0°F | Wt 202.2 lb

## 2020-09-22 DIAGNOSIS — R21 Rash and other nonspecific skin eruption: Secondary | ICD-10-CM

## 2020-09-22 DIAGNOSIS — D179 Benign lipomatous neoplasm, unspecified: Secondary | ICD-10-CM

## 2020-09-22 NOTE — Progress Notes (Signed)
Subjective:    Patient ID: Steven Powell, male    DOB: 1981-09-28, 39 y.o.   MRN: [id number]  HPI  Pt in for small lump that he noticed for about 2 weeks. It is small. No pain. No dc from nipple. No marijuana use.No family history of breast cancer.  Pt has face rash present wihich reviewed last visit. I rx'd nystatin and rash decreased some but still present.  Also rt forearm has appearance of persisting wart but not as prominent. He applied cryofreeze and compound w.  Review of Systems  Constitutional: Negative for chills, fatigue and fever.  Respiratory: Negative for cough, chest tightness, shortness of breath and wheezing.   Cardiovascular: Negative for chest pain and palpitations.  Gastrointestinal: Negative for abdominal pain, diarrhea, nausea and vomiting.  Genitourinary: Negative for dysuria.  Musculoskeletal: Negative for back pain.       See hpi. Left side chest.  Skin:       See hpi.  Neurological: Negative for dizziness, seizures and headaches.  Hematological: Negative for adenopathy. Does not bruise/bleed easily.  Psychiatric/Behavioral: Negative for behavioral problems and confusion.   Past Medical History:  Diagnosis Date  . Allergy    seasonal mostly in spring  . Asthma    childhood  . Diabetes mellitus type 2, controlled ([place]) 10/01/2017  . Gout    left knee  . Hyperlipidemia   . Hyperlipidemia associated with type 2 diabetes mellitus ([place]) 10/06/2017  . Preventative health care 10/06/2017  . Pseudogout    left knee     Social History   Socioeconomic History  . Marital status: Married    Spouse name: Not on file  . Number of children: Not on file  . Years of education: Not on file  . Highest education level: Not on file  Occupational History  . Not on file  Tobacco Use  . Smoking status: Never Smoker  . Smokeless tobacco: Never Used  Substance and Sexual Activity  . Alcohol use: Yes    Alcohol/week: 0.0 standard drinks  . Drug use: No  .  Sexual activity: Not on file  Other Topics Concern  . Not on file  Social History Narrative   Occupation: currently working for [employer]   Lives with wife and mom   Never Smoked    Alcohol use-yes (social)   No dietary restrictions, intermittent fasting          Social Determinants of Health   Financial Resource Strain:   . Difficulty of Paying Living Expenses: Not on file  Food Insecurity:   . Worried About Charity fundraiser in the Last Year: Not on file  . Ran Out of Food in the Last Year: Not on file  Transportation Needs:   . Lack of Transportation (Medical): Not on file  . Lack of Transportation (Non-Medical): Not on file  Physical Activity:   . Days of Exercise per Week: Not on file  . Minutes of Exercise per Session: Not on file  Stress:   . Feeling of Stress : Not on file  Social Connections:   . Frequency of Communication with Friends and Family: Not on file  . Frequency of Social Gatherings with Friends and Family: Not on file  . Attends Religious Services: Not on file  . Active Member of Clubs or Organizations: Not on file  . Attends Archivist Meetings: Not on file  . Marital Status: Not on file  Intimate Partner Violence:   . Fear of  Current or Ex-Partner: Not on file  . Emotionally Abused: Not on file  . Physically Abused: Not on file  . Sexually Abused: Not on file    Past Surgical History:  Procedure Laterality Date  . KNEE SURGERY    . KNEE SURGERY  2014   arthroscopy left knee, found pseudogout, and meniscal tea    Family History  Problem Relation Age of Onset  . Diabetes Other   . Hyperlipidemia Other   . Hypertension Other   . Hypertension Mother   . Diabetes Father   . Heart disease Father   . Asthma Daughter   . Stroke Maternal Grandmother   . Stroke Maternal Grandfather     No Known Allergies  Current Outpatient Medications on File Prior to Visit  Medication Sig Dispense Refill  . albuterol (PROVENTIL HFA;VENTOLIN HFA) 108  (90 Base) MCG/ACT inhaler Inhale 2 puffs into the lungs every 6 (six) hours as needed for wheezing or shortness of breath. 1 Inhaler 1  . allopurinol (ZYLOPRIM) 100 MG tablet TAKE 1 TABLET BY MOUTH EVERY DAY (Patient not taking: Reported on 08/25/2020) 90 tablet 1  . allopurinol (ZYLOPRIM) 100 MG tablet TAKE 1 TABLET BY MOUTH EVERY DAY 90 tablet 1  . colchicine 0.6 MG tablet TAKE 1 TABLET BY MOUTH EVERY DAY 90 tablet 1  . fluticasone (FLONASE) 50 MCG/ACT nasal spray Place 2 sprays into both nostrils daily. (Patient not taking: Reported on 08/25/2020) 16 g 2  . ibuprofen (ADVIL,MOTRIN) 200 MG tablet Take 1-2 tablets (200-400 mg total) by mouth every 6 (six) hours as needed. (Patient not taking: Reported on 08/25/2020) 30 tablet 0  . levocetirizine (XYZAL) 5 MG tablet Take 1 tablet (5 mg total) by mouth every evening. (Patient not taking: Reported on 08/25/2020) 30 tablet 3  . meloxicam (MOBIC) 15 MG tablet Take 15 mg by mouth daily as needed for pain. (Patient not taking: Reported on 09/22/2020)  1  . nystatin cream (MYCOSTATIN) Apply 1 application topically 2 (two) times daily. 30 g 0  . triamcinolone (KENALOG) 0.025 % ointment Apply 1 application topically 2 (two) times daily. (Patient not taking: Reported on 09/22/2020) 80 g 1   No current facility-administered medications on file prior to visit.    BP 120/80 (BP Location: Left Arm)   Pulse 87   Temp 98 F (36.7 C) (Oral)   Wt 202 lb 3.2 oz (91.7 kg)   SpO2 99%   BMI 34.71 kg/m       Objective:   Physical Exam  General- No acute distress. Pleasant patient. Neck- Full range of motion, no jvd Lungs- Clear, even and unlabored. Heart- regular rate and rhythm. Neurologic- CNII- XII grossly intact. Chest/breast- left side just between nipple and costochandral junction small barely palpable possible lipoma. Very mild gynecomastia at best. R side chest/breast normal.  Skin- mild red rash on face area adjacent to nose and upper lip. Small  probable wart rt mid forearm.      Assessment & Plan:  I do think you have either very small lipoma or very mild gynecomastia at best.   These are most likely diagnosis. Would ask that you follow up in 3 weeks ask you make appointment on day when both Dr. Charlett Blake and myself are in office.   Very rarely do I order mammogram in male patients. Want to evaluate if any changes on follow up and get second opinion before making a decision.  Refer to derm for rash on face despite tx and  probable persisting rt forearm wart.  Follow up 7 days or as needed

## 2020-09-22 NOTE — Patient Instructions (Addendum)
I do think you have either very small lipoma or very mild gynecomastia at best.   These are most likely diagnosis. Would ask that you follow up in 3 weeks ask you make appointment on day when both Dr. Charlett Blake and myself are in office.   Very rarely do I order mammogram in male patients. Want to evaluate if any changes on follow up and get second opinion before making a decision.  Refer to derm for rash on faced despite tx and probable persisting rt forearm wart.  Follow up 7 days or as needed

## 2020-10-14 ENCOUNTER — Ambulatory Visit (INDEPENDENT_AMBULATORY_CARE_PROVIDER_SITE_OTHER): Payer: BC Managed Care – PPO | Admitting: Medical

## 2020-10-14 ENCOUNTER — Other Ambulatory Visit: Payer: Self-pay

## 2020-10-14 VITALS — BP 120/85 | HR 80 | Resp 18 | Ht 64.0 in | Wt 203.0 lb

## 2020-10-14 DIAGNOSIS — D179 Benign lipomatous neoplasm, unspecified: Secondary | ICD-10-CM

## 2020-10-14 NOTE — Patient Instructions (Signed)
Your area today feels more like a lipoma. Based on the way it feels today will refer you to general surgeon to get there opinion.   Lipoma are benign fatty lumps. See education material below.  Follow up with [place] if area changes or new signs/symptoms before the referral appointment.   Lipoma  A lipoma is a noncancerous (benign) tumor that is made up of fat cells. This is a very common type of soft-tissue growth. Lipomas are usually found under the skin (subcutaneous). They may occur in any tissue of the body that contains fat. Common areas for lipomas to appear include the back, arms, shoulders, buttocks, and thighs. Lipomas grow slowly, and they are usually painless. Most lipomas do not cause problems and do not require treatment. What are the causes? The cause of this condition is not known. What increases the risk? You are more likely to develop this condition if:  You are 33-30 years old.  You have a family history of lipomas. What are the signs or symptoms? A lipoma usually appears as a small, round bump under the skin. In most cases, the lump will:  Feel soft or rubbery.  Not cause pain or other symptoms. However, if a lipoma is located in an area where it pushes on nerves, it can become painful or cause other symptoms. How is this diagnosed? A lipoma can usually be diagnosed with a physical exam. You may also have tests to confirm the diagnosis and to rule out other conditions. Tests may include:  Imaging tests, such as a CT scan or an MRI.  Removal of a tissue sample to be looked at under a microscope (biopsy). How is this treated? Treatment for this condition depends on the size of the lipoma and whether it is causing any symptoms.  For small lipomas that are not causing problems, no treatment is needed.  If a lipoma is bigger or it causes problems, surgery may be done to remove the lipoma. Lipomas can also be removed to improve appearance. Most often, the procedure is  done after applying a medicine that numbs the area (local anesthetic).  Liposuction may be done to reduce the size of the lipoma before it is removed through surgery, or it may be done to remove the lipoma. Lipomas are removed with this method in order to limit incision size and scarring. A liposuction tube is inserted through a small incision into the lipoma, and the contents of the lipoma are removed through the tube with suction. Follow these instructions at home:  Watch your lipoma for any changes.  Keep all follow-up visits as told by your health care provider. This is important. Contact a health care provider if:  Your lipoma becomes larger or hard.  Your lipoma becomes painful, red, or increasingly swollen. These could be signs of infection or a more serious condition. Get help right away if:  You develop tingling or numbness in an area near the lipoma. This could indicate that the lipoma is causing nerve damage. Summary  A lipoma is a noncancerous tumor that is made up of fat cells.  Most lipomas do not cause problems and do not require treatment.  If a lipoma is bigger or it causes problems, surgery may be done to remove the lipoma.  Contact a health care provider if your lipoma becomes larger or hard, or if it becomes painful, red, or increasingly swollen. Pain, redness, and swelling could be signs of infection or a more serious condition. This information is  not intended to replace advice given to you by your health care provider. Make sure you discuss any questions you have with your health care provider. Document Revised: 08/03/2019 Document Reviewed: 08/03/2019 Elsevier Patient Education  [employer].

## 2020-10-14 NOTE — Progress Notes (Signed)
   Subjective:    Patient ID: Steven Powell, male    DOB: [birth year], 39 y.o.   MRN: [id number]  HPI  Pt I for follow up. No change per pt.   Wanted to see with his pcp in office. Asked to get scheduled when pcp in office but not sure what happened.  In male did not want to do unnecessary imaging. Thus wanted her opinion.   No family hx of breast ca. Pt has no nipple dc.    Review of Systems  Constitutional: Negative for fatigue and fever.  Respiratory: Negative for cough, chest tightness, shortness of breath and wheezing.   Cardiovascular: Negative for chest pain and palpitations.  Gastrointestinal: Negative for abdominal pain.  Musculoskeletal:       See hpi. pect region likely lipoma.  Hematological: Negative for adenopathy. Does not bruise/bleed easily.  Psychiatric/Behavioral: Negative for behavioral problems.       Objective:   Physical Exam  Your area today feels more like a lipoma. Based on the way it feels today will refer you to general surgeon to get there opinion.   Lipoma are benign fatty lumps. See education material below.  Follow up with [place] if area changes or new signs/symptoms before the referral appointment.      Assessment & Plan:  Your area today feels more like a lipoma. Based on the way it feels today will refer you to general surgeon to get there opinion.   Lipoma are benign fatty lumps. See education material below.  Follow up with [place] if area changes or new signs/symptoms before the referral appointment.  Mackie Pai, PA-C

## 2020-12-25 ENCOUNTER — Other Ambulatory Visit: Payer: Self-pay

## 2020-12-25 ENCOUNTER — Emergency Department (HOSPITAL_BASED_OUTPATIENT_CLINIC_OR_DEPARTMENT_OTHER)
Admission: EM | Admit: 2020-12-25 | Discharge: 2020-12-25 | Disposition: A | Discharge status: Home / Self Care | Payer: BC Managed Care – PPO | Attending: Emergency Medicine | Admitting: Emergency Medicine

## 2020-12-25 ENCOUNTER — Emergency Department (HOSPITAL_BASED_OUTPATIENT_CLINIC_OR_DEPARTMENT_OTHER): Payer: BC Managed Care – PPO

## 2020-12-25 ENCOUNTER — Encounter (HOSPITAL_BASED_OUTPATIENT_CLINIC_OR_DEPARTMENT_OTHER): Payer: Self-pay

## 2020-12-25 DIAGNOSIS — R000 Tachycardia, unspecified: Secondary | ICD-10-CM | POA: Diagnosis not present

## 2020-12-25 DIAGNOSIS — E119 Type 2 diabetes mellitus without complications: Secondary | ICD-10-CM | POA: Diagnosis not present

## 2020-12-25 DIAGNOSIS — M546 Pain in thoracic spine: Secondary | ICD-10-CM | POA: Insufficient documentation

## 2020-12-25 DIAGNOSIS — Z7951 Long term (current) use of inhaled steroids: Secondary | ICD-10-CM | POA: Diagnosis not present

## 2020-12-25 DIAGNOSIS — R202 Paresthesia of skin: Secondary | ICD-10-CM

## 2020-12-25 DIAGNOSIS — J45909 Unspecified asthma, uncomplicated: Secondary | ICD-10-CM | POA: Diagnosis not present

## 2020-12-25 DIAGNOSIS — R519 Headache, unspecified: Secondary | ICD-10-CM | POA: Insufficient documentation

## 2020-12-25 LAB — BASIC METABOLIC PANEL
Anion gap: 13 (ref 5–15)
BUN: 16 mg/dL (ref 6–20)
CO2: 25 mmol/L (ref 22–32)
Calcium: 9.9 mg/dL (ref 8.9–10.3)
Chloride: 100 mmol/L (ref 98–111)
Creatinine, Ser: 1.26 mg/dL — ABNORMAL HIGH (ref 0.61–1.24)
GFR, Estimated: >60 mL/min (ref >60)
Glucose, Bld: 107 mg/dL — ABNORMAL HIGH (ref 70–99)
Potassium: 3.7 mmol/L (ref 3.5–5.1)
Sodium: 138 mmol/L (ref 135–145)

## 2020-12-25 LAB — CBC
HCT: 48.4 % (ref 39.0–52.0)
Hemoglobin: 16.4 g/dL (ref 13.0–17.0)
MCH: 30.5 pg (ref 26.0–34.0)
MCHC: 33.9 g/dL (ref 30.0–36.0)
MCV: 90.1 fL (ref 80.0–100.0)
Platelets: 346 10*3/uL (ref 150–400)
RBC: 5.37 MIL/uL (ref 4.22–5.81)
RDW: 12.5 % (ref 11.5–15.5)
WBC: 8.9 10*3/uL (ref 4.0–10.5)
nRBC: 0 % (ref 0.0–0.2)

## 2020-12-25 LAB — TROPONIN I (HIGH SENSITIVITY): Troponin I (High Sensitivity): 4 ng/L (ref <18)

## 2020-12-25 NOTE — ED Notes (Signed)
States developed a HA in the past hour, slow onset, denies any vision issues or light sensitivity.

## 2020-12-25 NOTE — Discharge Instructions (Signed)
Return to the ED for one sided numbness or weakness, difficulty with speech or swallowing.  Follow up with your family doctor.

## 2020-12-25 NOTE — ED Triage Notes (Signed)
Pt arrives complaining of pins and needles to left arm on and off for the past week. Headache and hypertension. Intermittent SOB & pain to shoulder blade. Been taking nyquil Z with melatonin daily for the past couple weeks.

## 2020-12-25 NOTE — ED Provider Notes (Signed)
[facility] Provider Note   CSN: [id number] Arrival date & time: 12/25/20  1938     History Chief Complaint  Patient presents with   Headache   Hypertension    Steven Powell is a 39 y.o. male.  39 yo M with a chief complaints of left arm paresthesias.  This is been off and on.  Described as a tingling.  Diffusely around the arm.  Seems to come and go.  Nothing seems to make it better or worse.  Not reproduced with any certain movement.  Has been seeing a chiropractor for chronic right-sided upper back pain.  Not see him this week due to the holiday.  He denies any injury.  He checked his blood pressure today and was concerned because it was elevated.  Developed a headache upon arrival here.  Denies trauma to the head.  No history of headaches.  Headache is already resolved.  It is left-sided.  Nothing seem to make it better or worse.  Resolved spontaneously.  He denies any chest pain or trouble breathing.  The history is provided by the patient.  Headache Quality:  Dull Radiates to:  Does not radiate Severity currently:  8/10 Severity at highest:  8/10 Onset quality:  Gradual Duration:  2 days Timing:  Constant Progression:  Worsening Chronicity:  New Similar to prior headaches: no   Relieved by:  Nothing Worsened by:  Nothing Ineffective treatments:  None tried Associated symptoms: numbness (paresthesias)   Associated symptoms: no abdominal pain, no congestion, no diarrhea, no fever, no myalgias, no vomiting and no weakness   Hypertension Associated symptoms include headaches. Pertinent negatives include no chest pain, no abdominal pain and no shortness of breath.       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   Hyperlipidemia associated with type 2 diabetes mellitus ([place]) 10/06/2017   Left knee pain 10/01/2017   Diabetes mellitus type 2, controlled ([place]) 10/01/2017   OSA (obstructive sleep apnea) 10/01/2017   Gout    Reactive airway disease 04/19/2011    Past Surgical History:  Procedure Laterality Date   KNEE SURGERY     KNEE SURGERY  2014   arthroscopy left knee, found pseudogout, and meniscal tea       Family History  Problem Relation Age of Onset   Diabetes Other    Hyperlipidemia Other    Hypertension Other    Hypertension Mother    Diabetes Father    Heart disease Father    Asthma Daughter    Stroke Maternal Grandmother    Stroke Maternal Grandfather     Social History   Tobacco Use   Smoking status: Never Smoker   Smokeless tobacco: Never Used  Substance Use Topics   Alcohol use: Yes    Alcohol/week: 0.0 standard drinks   Drug use: No    Home Medications Prior to Admission medications   Medication Sig Start  Date End Date Taking? Authorizing Provider  albuterol (PROVENTIL HFA;VENTOLIN HFA) 108 (90 Base) MCG/ACT inhaler Inhale 2 puffs into the lungs every 6 (six) hours as needed for wheezing or shortness of breath. 03/18/19   Mosie Lukes, MD  allopurinol (ZYLOPRIM) 100 MG tablet TAKE 1 TABLET BY MOUTH EVERY DAY Patient not taking: Reported on 08/25/2020 05/26/19   Mosie Lukes, MD  allopurinol (ZYLOPRIM) 100 MG tablet TAKE 1 TABLET BY MOUTH EVERY DAY 01/19/20   Mosie Lukes, MD  colchicine 0.6 MG tablet TAKE 1 TABLET BY MOUTH EVERY DAY 04/26/20   Mosie Lukes, MD  fluticasone (FLONASE) 50 MCG/ACT nasal spray Place 2 sprays into both nostrils daily. Patient not  taking: Reported on 08/25/2020 04/07/20   Saguier, Percell Miller, PA-C  ibuprofen (ADVIL,MOTRIN) 200 MG tablet Take 1-2 tablets (200-400 mg total) by mouth every 6 (six) hours as needed. Patient not taking: Reported on 08/25/2020 10/01/17   Mosie Lukes, MD  levocetirizine (XYZAL) 5 MG tablet Take 1 tablet (5 mg total) by mouth every evening. Patient not taking: Reported on 08/25/2020 04/07/20   Saguier, Percell Miller, PA-C  meloxicam (MOBIC) 15 MG tablet Take 15 mg by mouth daily as needed for pain. Patient not taking: Reported on 09/22/2020 06/02/18   [provider]  nystatin cream (MYCOSTATIN) Apply 1 application topically 2 (two) times daily. 08/25/20   Saguier, Percell Miller, PA-C  triamcinolone (KENALOG) 0.025 % ointment Apply 1 application topically 2 (two) times daily. Patient not taking: Reported on 09/22/2020 03/10/19   Mosie Lukes, MD    Allergies    Patient has no known allergies.  Review of Systems   Review of Systems  Constitutional: Negative for chills and fever.  HENT: Negative for congestion and facial swelling.   Eyes: Negative for discharge and visual disturbance.  Respiratory: Negative for shortness of breath.   Cardiovascular: Negative for chest pain and palpitations.  Gastrointestinal: Negative for abdominal pain, diarrhea and vomiting.  Musculoskeletal: Negative for arthralgias and myalgias.  Skin: Negative for color change and rash.  Neurological: Positive for numbness (paresthesias) and headaches. Negative for tremors, syncope and weakness.  Psychiatric/Behavioral: Negative for confusion and dysphoric mood.    Physical Exam Updated Vital Signs BP (!) 153/103    Pulse (!) 109    Temp 98.1 F (36.7 C) (Oral)    Resp 17    Ht 5\' 4"  (1.626 m)    Wt 92.5 kg    SpO2 100%    BMI 35.02 kg/m   Physical Exam Vitals and nursing note reviewed.  Constitutional:      Appearance: He is well-developed and well-nourished.  HENT:     Head: Normocephalic and atraumatic.  Eyes:      Extraocular Movements: EOM normal.     Pupils: Pupils are equal, round, and reactive to light.  Neck:     Vascular: No JVD.  Cardiovascular:     Rate and Rhythm: Regular rhythm. Tachycardia present.     Heart sounds: No murmur heard. No friction rub. No gallop.   Pulmonary:     Effort: No respiratory distress.     Breath sounds: No wheezing.  Abdominal:     General: There is no distension.     Tenderness: There is no guarding or rebound.  Musculoskeletal:        General: Normal range of motion.     Cervical back: Normal range of motion and neck supple.     Comments: Pain to the R trapezius.  Negative spurlings.  PMS intact to the LUE.  Full ROM of the shoulder.  No midline tenderness.   Skin:    Coloration: Skin is not pale.     Findings: No rash.  Neurological:     Mental Status: He is alert and oriented to person, place, and time.     Cranial Nerves: Cranial nerves are intact.     Sensory: Sensation is intact.     Motor: Motor function is intact.     Coordination: Coordination is intact.     Gait: Gait is intact.     Comments: Benign neurologic exam.  Ambulates without difficulty.  Psychiatric:        Mood and Affect: Mood and affect normal.        Behavior: Behavior normal.     ED Results / Procedures / Treatments   Labs (all labs ordered are listed, but only abnormal results are displayed) Labs Reviewed  BASIC METABOLIC PANEL - Abnormal; Notable for the following components:      Result Value   Glucose, Bld 107 (*)    Creatinine, Ser 1.26 (*)    All other components within normal limits  CBC  TROPONIN I (HIGH SENSITIVITY)    EKG EKG Interpretation  Date/Time:  Sunday December 25 2020 19:44:58 EST Ventricular Rate:  117 PR Interval:  162 QRS Duration: 70 QT Interval:  308 QTC Calculation: 429 R Axis:   14 Text Interpretation: Sinus tachycardia Cannot rule out Anterior infarct , age undetermined Abnormal ECG No significant change since last tracing  Confirmed by Nat Lowenthal ([id number]) on 12/25/2020 8:34:55 PM   Radiology DG Chest 2 View  Result Date: 12/25/2020 CLINICAL DATA:  Intermittent shortness of breath. EXAM: CHEST - 2 VIEW COMPARISON:  06/15/2018 FINDINGS: Lungs are adequately inflated without focal airspace consolidation or effusion. Cardiomediastinal silhouette, bones and soft tissues are unchanged. IMPRESSION: No active cardiopulmonary disease. Electronically Signed   By: Daniel  Boyle M.D.   On: 12/25/2020 20:28    Procedures Procedures (including critical care time)  Medications Ordered in ED Medications - No data to display  ED Course  I have reviewed the triage vital signs and the nursing notes.  Pertinent labs & imaging results that were available during my care of the patient were reviewed by me and considered in my medical decision making (see chart for details).    MDM Rules/Calculators/A&P                          39  yo M with with a chief complaints of paresthesias to the left upper extremity.  Has been coming and going for the past week or so.  He thinks it may have corresponded to when he did not see his chiropractor this week.  He had been having pain to the right upper portion of his back.  He does have trapezius tenderness on exam.  He is significantly tachycardic here though he does endorse being very anxious.  He also is mildly hypertensive which is one of his primary concerns.  He was thought maybe he was having a stroke with the tingling to his left upper extremity off and on and when he checked his blood pressure noted it was high and checked it multiple times at home H with rising elevation.  He developed a headache upon arrival which is resolved.  He is still tachycardic and so I offered to treat him with IV fluids and a headache cocktail although  his headache had resolved.  Patient is declining any intervention.  He feels like that may make him more nervous.  You prefer to call his family doctor in the  morning.  I urged him to return for any worsening symptoms.  He had a laboratory evaluation performed in triage troponin was negative EKG with sinus tachycardia.  Chest x-ray viewed by me without focal infiltrate or pneumothorax.  We will have him follow-up with his family doctor in the office.  10:11 PM:  I have discussed the diagnosis/risks/treatment options with the patient and believe the pt to be eligible for discharge home to follow-up with PCP. We also discussed returning to the ED immediately if new or worsening sx occur. We discussed the sx which are most concerning (e.g., sudden worsening pain, fever, inability to tolerate by mouth) that necessitate immediate return. Medications administered to the patient during their visit and any new prescriptions provided to the patient are listed below.  Medications given during this visit Medications - No data to display   The patient appears reasonably screen and/or stabilized for discharge and I doubt any other medical condition or other [facility] requiring further screening, evaluation, or treatment in the ED at this time prior to discharge.   Final Clinical Impression(s) / ED Diagnoses Final diagnoses:  Paresthesia  Left-sided headache    Rx / DC Orders ED Discharge Orders    None       Deno Etienne, DO 12/25/20 2211

## 2020-12-25 NOTE — ED Notes (Signed)
RN @bedside ; imaging tbd after labs

## 2021-01-08 ENCOUNTER — Other Ambulatory Visit: Payer: Self-pay | Admitting: Family Medicine

## 2021-02-28 ENCOUNTER — Other Ambulatory Visit (HOSPITAL_COMMUNITY): Payer: Self-pay | Admitting: Neurology

## 2021-03-09 MED FILL — CHLORHEXIDINE 0.12% RINSE: 0.12 | 17 days supply | Qty: 473 | Fill #0

## 2021-04-17 ENCOUNTER — Other Ambulatory Visit (HOSPITAL_BASED_OUTPATIENT_CLINIC_OR_DEPARTMENT_OTHER): Payer: Self-pay

## 2021-04-17 ENCOUNTER — Other Ambulatory Visit: Payer: Self-pay

## 2021-04-17 ENCOUNTER — Ambulatory Visit (INDEPENDENT_AMBULATORY_CARE_PROVIDER_SITE_OTHER): Payer: BC Managed Care – PPO | Admitting: Medical

## 2021-04-17 VITALS — BP 137/88 | HR 94 | Temp 98.2°F | Resp 18 | Ht 64.0 in | Wt 200.0 lb

## 2021-04-17 DIAGNOSIS — R739 Hyperglycemia, unspecified: Secondary | ICD-10-CM | POA: Diagnosis not present

## 2021-04-17 DIAGNOSIS — M1000 Idiopathic gout, unspecified site: Secondary | ICD-10-CM | POA: Diagnosis not present

## 2021-04-17 DIAGNOSIS — E785 Hyperlipidemia, unspecified: Secondary | ICD-10-CM

## 2021-04-17 DIAGNOSIS — Z0000 Encounter for general adult medical examination without abnormal findings: Secondary | ICD-10-CM

## 2021-04-17 LAB — CBC WITH DIFFERENTIAL/PLATELET
Basophils Absolute: 0.1 10*3/uL (ref 0.0–0.1)
Basophils Relative: 1.1 % (ref 0.0–3.0)
Eosinophils Absolute: 0.6 10*3/uL (ref 0.0–0.7)
Eosinophils Relative: 6.6 % — ABNORMAL HIGH (ref 0.0–5.0)
HCT: 46.4 % (ref 39.0–52.0)
Hemoglobin: 15.5 g/dL (ref 13.0–17.0)
Lymphocytes Relative: 24.8 % (ref 12.0–46.0)
Lymphs Abs: 2.2 10*3/uL (ref 0.7–4.0)
MCHC: 33.3 g/dL (ref 30.0–36.0)
MCV: 91.4 fl (ref 78.0–100.0)
Monocytes Absolute: 0.7 10*3/uL (ref 0.1–1.0)
Monocytes Relative: 7.6 % (ref 3.0–12.0)
Neutro Abs: 5.4 10*3/uL (ref 1.4–7.7)
Neutrophils Relative %: 59.9 % (ref 43.0–77.0)
Platelets: 330 10*3/uL (ref 150.0–400.0)
RBC: 5.08 Mil/uL (ref 4.22–5.81)
RDW: 13.2 % (ref 11.5–15.5)
WBC: 9 10*3/uL (ref 4.0–10.5)

## 2021-04-17 LAB — COMPREHENSIVE METABOLIC PANEL
ALT: 62 U/L — ABNORMAL HIGH (ref 0–53)
AST: 30 U/L (ref 0–37)
Albumin: 4.7 g/dL (ref 3.5–5.2)
Alkaline Phosphatase: 75 U/L (ref 39–117)
BUN: 14 mg/dL (ref 6–23)
CO2: 28 mEq/L (ref 19–32)
Calcium: 10.1 mg/dL (ref 8.4–10.5)
Chloride: 104 mEq/L (ref 96–112)
Creatinine, Ser: 1.26 mg/dL (ref 0.40–1.50)
GFR: 72.61 mL/min (ref >60.00)
Glucose, Bld: 125 mg/dL — ABNORMAL HIGH (ref 70–99)
Potassium: 4.4 mEq/L (ref 3.5–5.1)
Sodium: 139 mEq/L (ref 135–145)
Total Bilirubin: 1 mg/dL (ref 0.2–1.2)
Total Protein: 7.7 g/dL (ref 6.0–8.3)

## 2021-04-17 LAB — HEMOGLOBIN A1C: Hgb A1c MFr Bld: 6.4 % (ref 4.6–6.5)

## 2021-04-17 LAB — LIPID PANEL
Cholesterol: 272 mg/dL — ABNORMAL HIGH (ref 0–200)
HDL: 42.8 mg/dL (ref >39.00)
LDL Cholesterol: 190 mg/dL — ABNORMAL HIGH (ref 0–99)
NonHDL: 229.36
Total CHOL/HDL Ratio: 6
Triglycerides: 199 mg/dL — ABNORMAL HIGH (ref 0.0–149.0)
VLDL: 39.8 mg/dL (ref 0.0–40.0)

## 2021-04-17 LAB — URIC ACID: Uric Acid, Serum: 8.3 mg/dL — ABNORMAL HIGH (ref 4.0–7.8)

## 2021-04-17 MED ORDER — ALLOPURINOL 100 MG PO TABS
100.0000 mg | ORAL_TABLET | Freq: Every day | ORAL | 1 refills | Status: DC
  Filled 2021-04-17: qty 30, 30d supply, fill #0

## 2021-04-17 NOTE — Patient Instructions (Addendum)
For you wellness exam today I have ordered cbc cmp and lipid panel.  Pt declines pneumonia vaccine.   Recommend exercise and healthy diet.  We will let you know lab results as they come in.  Follow up date appointment will be determined after lab review.   Allergies controlled with xyzal.  Elevated sugar in the past. Will get a1c today.  For weight loss recommend [employer].     Preventive Care 57-38 Years Old, Male Preventive care refers to lifestyle choices and visits with your health care provider that can promote health and wellness. This includes:  A yearly physical exam. This is also called an annual wellness visit.  Regular dental and eye exams.  Immunizations.  Screening for certain conditions.  Healthy lifestyle choices, such as: ? Eating a healthy diet. ? Getting regular exercise. ? Not using drugs or products that contain nicotine and tobacco. ? Limiting alcohol use. What can I expect for my preventive care visit? Physical exam Your health care provider may check your:  Height and weight. These may be used to calculate your BMI (body mass index). BMI is a measurement that tells if you are at a healthy weight.  Heart rate and blood pressure.  Body temperature.  Skin for abnormal spots. Counseling Your health care provider may ask you questions about your:  Past medical problems.  Family's medical history.  Alcohol, tobacco, and drug use.  Emotional well-being.  Home life and relationship well-being.  Sexual activity.  Diet, exercise, and sleep habits.  Work and work Statistician.  Access to firearms. What immunizations do I need? Vaccines are usually given at various ages, according to a schedule. Your health care provider will recommend vaccines for you based on your age, medical history, and lifestyle or other factors, such as travel or where you work.   What tests do I need? Blood tests  Lipid and cholesterol levels. These may be checked  every 5 years starting at age 63.  Hepatitis C test.  Hepatitis B test. Screening  Diabetes screening. This is done by checking your blood sugar (glucose) after you have not eaten for a while (fasting).  Genital exam to check for testicular cancer or hernias.  STD (sexually transmitted disease) testing, if you are at risk. Talk with your health care provider about your test results, treatment options, and if necessary, the need for more tests.   Follow these instructions at home: Eating and drinking  Eat a healthy diet that includes fresh fruits and vegetables, whole grains, lean protein, and low-fat dairy products.  Drink enough fluid to keep your urine pale yellow.  Take vitamin and mineral supplements as recommended by your health care provider.  Do not drink alcohol if your health care provider tells you not to drink.  If you drink alcohol: ? Limit how much you have to 0-2 drinks a day. ? Be aware of how much alcohol is in your drink. In the [place] one drink equals one 12 oz bottle of beer (355 mL), one 5 oz glass of wine (148 mL), or one 1 oz glass of hard liquor (44 mL).   Lifestyle  Take daily care of your teeth and gums. Brush your teeth every morning and night with fluoride toothpaste. Floss one time each day.  Stay active. Exercise for at least 30 minutes 5 or more days each week.  Do not use any products that contain nicotine or tobacco, such as cigarettes, e-cigarettes, and chewing tobacco. If you need help quitting,  ask your health care provider.  Do not use drugs.  If you are sexually active, practice safe sex. Use a condom or other form of protection to prevent STIs (sexually transmitted infections).  Find healthy ways to cope with stress, such as: ? Meditation, yoga, or listening to music. ? Journaling. ? Talking to a trusted person. ? Spending time with friends and family. Safety  Always wear your seat belt while driving or riding in a vehicle.  Do not  drive: ? If you have been drinking alcohol. Do not ride with someone who has been drinking. ? When you are tired or distracted. ? While texting.  Wear a helmet and other protective equipment during sports activities.  If you have firearms in your house, make sure you follow all gun safety procedures.  Seek help if you have been physically or sexually abused. What's next?  Go to your health care provider once a year for an annual wellness visit.  Ask your health care provider how often you should have your eyes and teeth checked.  Stay up to date on all vaccines. This information is not intended to replace advice given to you by your health care provider. Make sure you discuss any questions you have with your health care provider. Document Revised: 09/02/2019 Document Reviewed: 12/11/2018 Elsevier Patient Education  2021 [employer].

## 2021-04-17 NOTE — Progress Notes (Signed)
Subjective:    Patient ID: Steven Powell, male    DOB: 07-04-1981, 40 y.o.   MRN: [id number]  HPI Pt works [employer]. Pt does not exercise regularly. Pt eating moderate healthy. Non smoker. Rare alcohol use. Coffee every other day. Drinks 1 soda a day.  Pt uses xyzal for allergic rhinitis. Controlled allergies.  Hx of elevated sugar in past. 2 years ago was 6.7.   Review of Systems  Constitutional: Negative for chills, fatigue and fever.  HENT: Negative for congestion, ear pain, hearing loss, nosebleeds, postnasal drip, rhinorrhea, sinus pressure and sinus pain.   Respiratory: Negative for cough, chest tightness, shortness of breath and wheezing.   Cardiovascular: Negative for chest pain and palpitations.  Gastrointestinal: Negative for abdominal pain, diarrhea and rectal pain.  Genitourinary: Negative for dysuria, flank pain, frequency, penile pain, testicular pain and urgency.  Musculoskeletal: Negative for back pain.  Skin: Negative for rash.  Neurological: Negative for dizziness, seizures, syncope, weakness and headaches.  Hematological: Negative for adenopathy. Does not bruise/bleed easily.  Psychiatric/Behavioral: Negative for behavioral problems and confusion.    Past Medical History:  Diagnosis Date  . Allergy    seasonal mostly in spring  . Asthma    childhood  . Diabetes mellitus type 2, controlled ([place]) 10/01/2017  . Gout    left knee  . Hyperlipidemia   . Hyperlipidemia associated with type 2 diabetes mellitus ([place]) 10/06/2017  . Preventative health care 10/06/2017  . Pseudogout    left knee     Social History   Socioeconomic History  . Marital status: Married    Spouse name: Not on file  . Number of children: Not on file  . Years of education: Not on file  . Highest education level: Not on file  Occupational History  . Not on file  Tobacco Use  . Smoking status: Never Smoker  . Smokeless tobacco: Never Used  Substance and Sexual Activity  .  Alcohol use: Yes    Alcohol/week: 0.0 standard drinks  . Drug use: No  . Sexual activity: Not on file  Other Topics Concern  . Not on file  Social History Narrative   Occupation: currently working for [employer]   Lives with wife and mom   Never Smoked    Alcohol use-yes (social)   No dietary restrictions, intermittent fasting          Social Determinants of Radio broadcast assistant Strain: Not on file  Food Insecurity: Not on file  Transportation Needs: Not on file  Physical Activity: Not on file  Stress: Not on file  Social Connections: Not on file  Intimate Partner Violence: Not on file    Past Surgical History:  Procedure Laterality Date  . KNEE SURGERY    . KNEE SURGERY  2014   arthroscopy left knee, found pseudogout, and meniscal tea    Family History  Problem Relation Age of Onset  . Diabetes Other   . Hyperlipidemia Other   . Hypertension Other   . Hypertension Mother   . Diabetes Father   . Heart disease Father   . Asthma Daughter   . Stroke Maternal Grandmother   . Stroke Maternal Grandfather     No Known Allergies  Current Outpatient Medications on File Prior to Visit  Medication Sig Dispense Refill  . allopurinol (ZYLOPRIM) 100 MG tablet Take 1 tablet (100 mg total) by mouth daily. 90 tablet 1  . colchicine 0.6 MG tablet TAKE 1 TABLET BY MOUTH  EVERY DAY 90 tablet 1   No current facility-administered medications on file prior to visit.    BP 137/88   Pulse 94   Temp 98.2 F (36.8 C)   Resp 18   Ht 5\' 4"  (1.626 m)   Wt 200 lb (90.7 kg)   SpO2 99%   BMI 34.33 kg/m       Objective:   Physical Exam  General Mental Status- Alert. General Appearance- Not in acute distress.   Skin General: Color- Normal Color. Moisture- Normal Moisture.  Neck Carotid Arteries- Normal color. Moisture- Normal Moisture. No carotid bruits. No JVD.  Chest and Lung Exam Auscultation: Breath Sounds:-Normal.  Cardiovascular Auscultation:Rythm-  Regular. Murmurs & Other Heart Sounds:Auscultation of the heart reveals- No Murmurs.  Abdomen Inspection:-Inspeection Normal. Palpation/Percussion:Note:No mass. Palpation and Percussion of the abdomen reveal- Non Tender, Non Distended + BS, no rebound or guarding.   Neurologic Cranial Nerve exam:- CN III-XII intact(No nystagmus), symmetric smile. Strength:- 5/5 equal and symmetric strength both upper and lower extremities.      Assessment & Plan:  For you wellness exam today I have ordered cbc cmp and lipid panel.  Pt declines pneumonia vaccine.  Recommend exercise and healthy diet.  We will let you know lab results as they come in.  Follow up date appointment will be determined after lab review.   Allergies controlled with xyzal.  Elevated sugar in the past. Will get a1c today.  For weight loss recommend [employer].  Mackie Pai, PA-C

## 2021-04-17 NOTE — Addendum Note (Signed)
Addended by: Anabel Halon on: 04/17/2021 11:01 AM   Modules accepted: Orders

## 2021-05-15 ENCOUNTER — Telehealth: Payer: Self-pay | Admitting: Medical

## 2021-05-15 ENCOUNTER — Telehealth: Payer: Self-pay

## 2021-05-15 MED ORDER — ALLOPURINOL 300 MG PO TABS
300.0000 mg | ORAL_TABLET | Freq: Every day | ORAL | 6 refills | Status: DC
  Filled 2021-05-15: qty 30, 30d supply, fill #0

## 2021-05-15 NOTE — Telephone Encounter (Signed)
Rx allopurinol 300 mg sent to pt pharmacy.

## 2021-05-15 NOTE — Telephone Encounter (Signed)
Pt was last seen 04/17/21 for CPE and you discussed increasing his allopurinol for his gout. He would like to do so please advise if that is okay.

## 2021-05-15 NOTE — Telephone Encounter (Signed)
Rx allopurinol sent to pt pharmacy. 

## 2021-05-16 ENCOUNTER — Encounter: Payer: Self-pay | Admitting: Medical

## 2021-05-16 ENCOUNTER — Telehealth: Payer: Self-pay | Admitting: Medical

## 2021-05-16 ENCOUNTER — Other Ambulatory Visit (HOSPITAL_BASED_OUTPATIENT_CLINIC_OR_DEPARTMENT_OTHER): Payer: Self-pay

## 2021-05-16 MED ORDER — ALLOPURINOL 100 MG PO TABS
ORAL_TABLET | ORAL | 3 refills | Status: DC
  Filled 2021-05-16: qty 60, 30d supply, fill #0
  Filled 2021-06-14: qty 60, 30d supply, fill #1

## 2021-05-16 NOTE — Telephone Encounter (Signed)
Opened to review 

## 2021-05-16 NOTE — Telephone Encounter (Signed)
Rx allopurinol sent to pt pharmacy. 

## 2021-05-17 ENCOUNTER — Other Ambulatory Visit (HOSPITAL_BASED_OUTPATIENT_CLINIC_OR_DEPARTMENT_OTHER): Payer: Self-pay

## 2021-05-31 ENCOUNTER — Telehealth: Payer: Self-pay

## 2021-05-31 ENCOUNTER — Other Ambulatory Visit (HOSPITAL_BASED_OUTPATIENT_CLINIC_OR_DEPARTMENT_OTHER): Payer: Self-pay

## 2021-05-31 MED ORDER — METHYLPREDNISOLONE 4 MG PO TBPK
ORAL_TABLET | ORAL | 0 refills | Status: DC
  Filled 2021-05-31: qty 21, 6d supply, fill #0

## 2021-05-31 NOTE — Telephone Encounter (Signed)
Rx medrol dose sent to pt pharmacy. Considered higher dose prednisone but 3 years ago a1c 6.7. April a1c 6.4.

## 2021-05-31 NOTE — Telephone Encounter (Signed)
Patient's wife states He is having a Gout flair up and was wondering if he can get a refill of the meloxicam? It used to help with the flairs in the past

## 2021-06-15 ENCOUNTER — Other Ambulatory Visit (HOSPITAL_BASED_OUTPATIENT_CLINIC_OR_DEPARTMENT_OTHER): Payer: Self-pay

## 2021-06-16 ENCOUNTER — Other Ambulatory Visit (HOSPITAL_BASED_OUTPATIENT_CLINIC_OR_DEPARTMENT_OTHER): Payer: Self-pay

## 2021-06-16 ENCOUNTER — Other Ambulatory Visit: Payer: Self-pay

## 2021-06-16 MED ORDER — ALLOPURINOL 100 MG PO TABS
ORAL_TABLET | ORAL | 3 refills | Status: DC
  Filled 2021-06-16: qty 60, 30d supply, fill #0

## 2021-06-22 ENCOUNTER — Ambulatory Visit: Payer: BC Managed Care – PPO | Admitting: Family

## 2021-06-22 ENCOUNTER — Other Ambulatory Visit: Payer: Self-pay

## 2021-06-22 ENCOUNTER — Encounter: Payer: Self-pay | Admitting: Family

## 2021-06-22 VITALS — BP 138/90 | HR 92 | Temp 98.0°F | Resp 12 | Ht 64.0 in | Wt 200.0 lb

## 2021-06-22 DIAGNOSIS — R030 Elevated blood-pressure reading, without diagnosis of hypertension: Secondary | ICD-10-CM

## 2021-06-22 DIAGNOSIS — M542 Cervicalgia: Secondary | ICD-10-CM

## 2021-06-22 DIAGNOSIS — M1000 Idiopathic gout, unspecified site: Secondary | ICD-10-CM

## 2021-06-22 DIAGNOSIS — R739 Hyperglycemia, unspecified: Secondary | ICD-10-CM

## 2021-06-22 MED ORDER — ALLOPURINOL 100 MG PO TABS: ORAL_TABLET | ORAL | 1 refills | Status: DC

## 2021-06-22 NOTE — Progress Notes (Signed)
Steven Powell is a 40 y.o. 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 180 tablet 1   No current facility-administered medications for this visit.    Allergies: Patient has no known allergies.  Past Medical History:  Diagnosis Date   Allergy    seasonal mostly in spring   Asthma    childhood   Diabetes mellitus type 2, controlled ([place]) 10/01/2017   Gout    left knee   Hyperlipidemia    Hyperlipidemia associated with type 2 diabetes mellitus ([place]) 10/06/2017   Preventative health care 10/06/2017   Pseudogout    left knee    Past Surgical History:  Procedure Laterality Date   KNEE SURGERY     KNEE SURGERY  2014   arthroscopy left knee, found pseudogout, and meniscal tea    Family History  Problem Relation Age of Onset   Diabetes Other    Hyperlipidemia Other    Hypertension Other    Hypertension Mother    Diabetes Father    Heart disease Father    Asthma Daughter    Stroke Maternal Grandmother    Stroke Maternal Grandfather     Social History   Tobacco Use   Smoking status: Never   Smokeless tobacco: Never  Substance Use Topics   Alcohol use: Yes    Alcohol/week: 0.0 standard drinks    Subjective:   Seen at [facility] earlier this week with chest pain/ shortness of breath/ numbness/ tingling in left arm; exam was  unremarkable; had seen chiropractor 24 hours prior and had adjustment;    Objective:  Vitals:   06/22/21 1432  BP: 138/90  Pulse: 92  Resp: 12  Temp: 98 F (36.7 C)  TempSrc: Oral  SpO2: 98%  Weight: 200 lb (90.7 kg)  Height: 5\' 4"  (1.626 m)    General: Well developed, well nourished, in no acute distress  Skin : Warm and dry.  Head: Normocephalic and atraumatic  Eyes: Sclera and conjunctiva clear; pupils round and reactive to light; extraocular movements intact  Ears: External normal; canals clear; tympanic membranes normal  Oropharynx: Pink, supple. No suspicious lesions  Neck: Supple without thyromegaly, adenopathy  Lungs: Respirations unlabored; clear to auscultation bilaterally without wheeze, rales, rhonchi  CVS exam: normal rate and regular rhythm.  Extremities: No edema, cyanosis, clubbing  Vessels: Symmetric bilaterally  Neurologic: Alert and oriented; speech intact; face symmetrical; moves all extremities well; CNII-XII intact without focal deficit   Assessment:  1. Borderline hypertension   2. Elevated blood sugar   3. Idiopathic gout, unspecified chronicity, unspecified site   4. Neck pain     Plan:  DASH diet discussed; encouraged continue weight loss and exercise goals; continue to monitor blood pressure and re-check in 1 month; Plan to re-check Hgba1c in 1 month; work on limiting intake  of refined sugars;  Refill updated; Suspect secondary to recent chiropractor treatment; he will discuss options with chiropractor; if symptoms persist, to consider imaging and PT;  This visit occurred during the SARS-CoV-2 public health emergency.  Safety protocols were in place, including screening questions prior to the visit, additional usage of staff PPE, and extensive cleaning of exam room while observing appropriate contact time as indicated for disinfecting solutions.    Return in about 4 weeks (around 07/20/2021) for with Dr. Charlett Blake or Percell Miller.  No orders of the defined  types were placed in this encounter.   Requested Prescriptions   Signed Prescriptions Disp Refills   allopurinol (ZYLOPRIM) 100 MG tablet 180 tablet 1    Sig: Take 2 tablets by mouth daily.

## 2021-06-22 NOTE — Patient Instructions (Signed)
https://www.nhlbi.nih.gov/files/docs/public/heart/dash_brief.pdf">  DASH Eating Plan DASH stands for Dietary Approaches to Stop Hypertension. The DASH eating plan is a healthy eating plan that has been shown to: Reduce high blood pressure (hypertension). Reduce your risk for type 2 diabetes, heart disease, and stroke. Help with weight loss. What are tips for following this plan? Reading food labels Check food labels for the amount of salt (sodium) per serving. Choose foods with less than 5 percent of the Daily Value of sodium. Generally, foods with less than 300 milligrams (mg) of sodium per serving fit into this eating plan. To find whole grains, look for the word "whole" as the first word in the ingredient list. Shopping Buy products labeled as "low-sodium" or "no salt added." Buy fresh foods. Avoid canned foods and pre-made or frozen meals. Cooking Avoid adding salt when cooking. Use salt-free seasonings or herbs instead of table salt or sea salt. Check with your health care provider or pharmacist before using salt substitutes. Do not fry foods. Cook foods using healthy methods such as baking, boiling, grilling, roasting, and broiling instead. Cook with heart-healthy oils, such as olive, canola, avocado, soybean, or sunflower oil. Meal planning  Eat a balanced diet that includes: 4 or more servings of fruits and 4 or more servings of vegetables each day. Try to fill one-half of your plate with fruits and vegetables. 6-8 servings of whole grains each day. Less than 6 oz (170 g) of lean meat, poultry, or fish each day. A 3-oz (85-g) serving of meat is about the same size as a deck of cards. One egg equals 1 oz (28 g). 2-3 servings of low-fat dairy each day. One serving is 1 cup (237 mL). 1 serving of nuts, seeds, or beans 5 times each week. 2-3 servings of heart-healthy fats. Healthy fats called omega-3 fatty acids are found in foods such as walnuts, flaxseeds, fortified milks, and eggs.  These fats are also found in cold-water fish, such as sardines, salmon, and mackerel. Limit how much you eat of: Canned or prepackaged foods. Food that is high in trans fat, such as some fried foods. Food that is high in saturated fat, such as fatty meat. Desserts and other sweets, sugary drinks, and other foods with added sugar. Full-fat dairy products. Do not salt foods before eating. Do not eat more than 4 egg yolks a week. Try to eat at least 2 vegetarian meals a week. Eat more home-cooked food and less restaurant, buffet, and fast food.  Lifestyle When eating at a restaurant, ask that your food be prepared with less salt or no salt, if possible. If you drink alcohol: Limit how much you use to: 0-1 drink a day for women who are not pregnant. 0-2 drinks a day for men. Be aware of how much alcohol is in your drink. In the [place] one drink equals one 12 oz bottle of beer (355 mL), one 5 oz glass of wine (148 mL), or one 1 oz glass of hard liquor (44 mL). General information Avoid eating more than 2,300 mg of salt a day. If you have hypertension, you may need to reduce your sodium intake to 1,500 mg a day. Work with your health care provider to maintain a healthy body weight or to lose weight. Ask what an ideal weight is for you. Get at least 30 minutes of exercise that causes your heart to beat faster (aerobic exercise) most days of the week. Activities may include walking, swimming, or biking. Work with your health care provider   or dietitian to adjust your eating plan to your individual calorie needs. What foods should I eat? Fruits All fresh, dried, or frozen fruit. Canned fruit in natural juice (without addedsugar). Vegetables Fresh or frozen vegetables (raw, steamed, roasted, or grilled). Low-sodium or reduced-sodium tomato and vegetable juice. Low-sodium or reduced-sodium tomatosauce and tomato paste. Low-sodium or reduced-sodium canned vegetables. Grains Whole-grain or  whole-wheat bread. Whole-grain or whole-wheat pasta. Brown rice. Oatmeal. Quinoa. Bulgur. Whole-grain and low-sodium cereals. Pita bread.Low-fat, low-sodium crackers. Whole-wheat flour tortillas. Meats and other proteins Skinless chicken or turkey. Ground chicken or turkey. Pork with fat trimmed off. Fish and seafood. Egg whites. Dried beans, peas, or lentils. Unsalted nuts, nut butters, and seeds. Unsalted canned beans. Lean cuts of beef with fat trimmed off. Low-sodium, lean precooked or cured meat, such as sausages or meatloaves. Dairy Low-fat (1%) or fat-free (skim) milk. Reduced-fat, low-fat, or fat-free cheeses. Nonfat, low-sodium ricotta or cottage cheese. Low-fat or nonfatyogurt. Low-fat, low-sodium cheese. Fats and oils Soft margarine without trans fats. Vegetable oil. Reduced-fat, low-fat, or light mayonnaise and salad dressings (reduced-sodium). Canola, safflower, olive, avocado, soybean, andsunflower oils. Avocado. Seasonings and condiments Herbs. Spices. Seasoning mixes without salt. Other foods Unsalted popcorn and pretzels. Fat-free sweets. The items listed above may not be a complete list of foods and beverages you can eat. Contact a dietitian for more information. What foods should I avoid? Fruits Canned fruit in a light or heavy syrup. Fried fruit. Fruit in cream or buttersauce. Vegetables Creamed or fried vegetables. Vegetables in a cheese sauce. Regular canned vegetables (not low-sodium or reduced-sodium). Regular canned tomato sauce and paste (not low-sodium or reduced-sodium). Regular tomato and vegetable juice(not low-sodium or reduced-sodium). Pickles. Olives. Grains Baked goods made with fat, such as croissants, muffins, or some breads. Drypasta or rice meal packs. Meats and other proteins Fatty cuts of meat. Ribs. Fried meat. Bacon. Bologna, salami, and other precooked or cured meats, such as sausages or meat loaves. Fat from the back of a pig (fatback). Bratwurst.  Salted nuts and seeds. Canned beans with added salt. Canned orsmoked fish. Whole eggs or egg yolks. Chicken or turkey with skin. Dairy Whole or 2% milk, cream, and half-and-half. Whole or full-fat cream cheese. Whole-fat or sweetened yogurt. Full-fat cheese. Nondairy creamers. Whippedtoppings. Processed cheese and cheese spreads. Fats and oils Butter. Stick margarine. Lard. Shortening. Ghee. Bacon fat. Tropical oils, suchas coconut, palm kernel, or palm oil. Seasonings and condiments Onion salt, garlic salt, seasoned salt, table salt, and sea salt. Worcestershire sauce. Tartar sauce. Barbecue sauce. Teriyaki sauce. Soy sauce, including reduced-sodium. Steak sauce. Canned and packaged gravies. Fish sauce. Oyster sauce. Cocktail sauce. Store-bought horseradish. Ketchup. Mustard. Meat flavorings and tenderizers. Bouillon cubes. Hot sauces. Pre-made or packaged marinades. Pre-made or packaged taco seasonings. Relishes. Regular saladdressings. Other foods Salted popcorn and pretzels. The items listed above may not be a complete list of foods and beverages you should avoid. Contact a dietitian for more information. Where to find more information National Heart, Lung, and Blood Institute: www.nhlbi.nih.gov American Heart Association: www.heart.org Academy of Nutrition and Dietetics: www.eatright.org National Kidney Foundation: www.kidney.org Summary The DASH eating plan is a healthy eating plan that has been shown to reduce high blood pressure (hypertension). It may also reduce your risk for type 2 diabetes, heart disease, and stroke. When on the DASH eating plan, aim to eat more fresh fruits and vegetables, whole grains, lean proteins, low-fat dairy, and heart-healthy fats. With the DASH eating plan, you should limit salt (sodium) intake to 2,300   mg a day. If you have hypertension, you may need to reduce your sodium intake to 1,500 mg a day. Work with your health care provider or dietitian to adjust  your eating plan to your individual calorie needs. This information is not intended to replace advice given to you by your health care provider. Make sure you discuss any questions you have with your healthcare provider. Document Revised: 11/20/2019 Document Reviewed: 11/20/2019 Elsevier Patient Education  2022 Elsevier Inc.  

## 2021-08-01 NOTE — Progress Notes (Addendum)
[facility] [address], [place], [place] [address] [phone]  Date:  08/02/2021   Name:  Steven Powell   DOB:  March 08, 1981   MRN:  [id number]  PCP:  Mosie Lukes, MD    Chief Complaint: Blood Pressure Check, Lab work, and Asthma   History of Present Illness:  Steven Powell is a 40 y.o. very pleasant male patient who presents with the following:  Primary patient of my partner Dr. Charlett Blake, here today for blood pressure check I have seen him myself previously, most recently in 2019 History of diabetes, hyperlipidemia  He was seen at urgent care on June 21 with chest pain-evaluated and released to home.  He had EKG which was normal He was then seen in our office by Jodi Mourning on June 23 with borderline blood pressure elevation-he was asked to follow-up in about 1 month to check on his blood pressures and his A1c  His mother does have HTN His father has DM   He has been diligently checking his blood pressures over the last month.  In general, they have been lower than 135/85.  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   Dyslipidemia 10/06/2017   Left knee pain 10/01/2017   Prediabetes 10/01/2017   OSA (obstructive sleep apnea) 10/01/2017   Gout    Reactive airway disease 04/19/2011    Past Medical History:  Diagnosis Date   Allergy    seasonal mostly in spring   Asthma    childhood   Diabetes mellitus type 2, controlled ([place]) 10/01/2017   Gout    left knee    Hyperlipidemia    Hyperlipidemia associated with type 2 diabetes mellitus ([place]) 10/06/2017   Preventative health care 10/06/2017   Pseudogout    left knee    Past Surgical History:  Procedure Laterality Date   KNEE SURGERY     KNEE SURGERY  2014   arthroscopy left knee, found pseudogout, and meniscal tea    Social History   Tobacco Use   Smoking status: Never   Smokeless tobacco: Never  Substance Use Topics   Alcohol use: Yes    Alcohol/week: 0.0 standard drinks   Drug use: No    Family History  Problem Relation Age of Onset   Diabetes Other    Hyperlipidemia Other    Hypertension Other    Hypertension Mother    Diabetes Father    Heart disease Father    Asthma Daughter    Stroke Maternal Grandmother    Stroke Maternal Grandfather     No Known Allergies  Medication list has been reviewed and updated.  Current Outpatient Medications on File Prior to Visit  Medication Sig Dispense Refill   allopurinol (ZYLOPRIM) 100 MG tablet Take 2 tablets by mouth daily. 180 tablet 1   colchicine 0.6 MG tablet TAKE 1 TABLET BY MOUTH EVERY DAY 90 tablet 1   No current facility-administered medications on file prior to visit.  Review of Systems:  As per HPI- otherwise negative.   Physical Examination: Vitals:   08/02/21 1359  BP: 128/82  Pulse: 87  Resp: 17  Temp: 98.2 F (36.8 C)  SpO2: 97%   Vitals:   08/02/21 1359  Weight: 198 lb (89.8 kg)  Height: '5\' 4"'$  (1.626 m)   Body mass index is 33.99 kg/m. Ideal Body Weight: Weight in (lb) to have BMI = 25: 145.3  GEN: no acute distress.  Obese, looks well HEENT: Atraumatic, Normocephalic.  Ears and Nose: No external deformity. CV: RRR, No M/G/R. No JVD. No thrill. No extra heart sounds. PULM: CTA B, no wheezes, crackles, rhonchi. No retractions. No resp. distress. No accessory muscle use. ABD: S, NT, ND EXTR: No c/c/e PSYCH: Normally interactive. Conversant.    Assessment and Plan: Borderline  hypertension  OSA (obstructive sleep apnea)  Dyslipidemia - Plan: Lipid panel, Hepatic function panel  Prediabetes - Plan: Hemoglobin A1c  Patient seen today to follow-up on blood pressure.  He has had mild blood pressure elevation, today normal.  He has been checking his blood pressure regularly and is most often within normal range.  For the time being we will continue to monitor, he is given blood pressure parameters.  I encouraged him to continue exercise and healthy diet choices.  Work towards gradual weight loss  Follow-up on labs today- Will plan further follow- up pending labs.  This visit occurred during the SARS-CoV-2 public health emergency.  Safety protocols were in place, including screening questions prior to the visit, additional usage of staff PPE, and extensive cleaning of exam room while observing appropriate contact time as indicated for disinfecting solutions.   Signed Lamar Blinks, MD Addnd 8/4- received labs as below, message to pt  Results for orders placed or performed in visit on 08/02/21  Hemoglobin A1c  Result Value Ref Range   Hgb A1c MFr Bld 6.3 4.6 - 6.5 %  Lipid panel  Result Value Ref Range   Cholesterol 274 (H) 0 - 200 mg/dL   Triglycerides 167.0 (H) 0.0 - 149.0 mg/dL   HDL 45.80 >39.00 mg/dL   VLDL 33.4 0.0 - 40.0 mg/dL   LDL Cholesterol 195 (H) 0 - 99 mg/dL   Total CHOL/HDL Ratio 6    NonHDL 228.11   Hepatic function panel  Result Value Ref Range   Total Bilirubin 1.8 (H) 0.2 - 1.2 mg/dL   Bilirubin, Direct 0.2 0.0 - 0.3 mg/dL   Alkaline Phosphatase 74 39 - 117 U/L   AST 26 0 - 37 U/L   ALT 51 0 - 53 U/L   Total Protein 7.9 6.0 - 8.3 g/dL   Albumin 4.9 3.5 - 5.2 g/dL

## 2021-08-02 ENCOUNTER — Other Ambulatory Visit: Payer: Self-pay

## 2021-08-02 ENCOUNTER — Ambulatory Visit: Payer: BC Managed Care – PPO | Admitting: Family Medicine

## 2021-08-02 ENCOUNTER — Encounter: Payer: Self-pay | Admitting: Family Medicine

## 2021-08-02 VITALS — BP 128/82 | HR 87 | Temp 98.2°F | Resp 17 | Ht 64.0 in | Wt 198.0 lb

## 2021-08-02 DIAGNOSIS — R7303 Prediabetes: Secondary | ICD-10-CM | POA: Diagnosis not present

## 2021-08-02 DIAGNOSIS — G4733 Obstructive sleep apnea (adult) (pediatric): Secondary | ICD-10-CM

## 2021-08-02 DIAGNOSIS — R030 Elevated blood-pressure reading, without diagnosis of hypertension: Secondary | ICD-10-CM | POA: Diagnosis not present

## 2021-08-02 DIAGNOSIS — E785 Hyperlipidemia, unspecified: Secondary | ICD-10-CM | POA: Diagnosis not present

## 2021-08-02 DIAGNOSIS — E113531 Type 2 diabetes mellitus with proliferative diabetic retinopathy with traction retinal detachment not involving the macula, right eye: Secondary | ICD-10-CM

## 2021-08-02 NOTE — Patient Instructions (Signed)
Good to see you today!  Great job with exercise Overall your BP is ok Continue to check it 2-3x a week; goal is less than 135/85 We will check your A1c and lipids today  Keep working on gradual weight loss as well to control your blood pressure and prevent progression to diabetes

## 2021-08-03 ENCOUNTER — Encounter: Payer: Self-pay | Admitting: Family Medicine

## 2021-08-03 LAB — LIPID PANEL
Cholesterol: 274 mg/dL — ABNORMAL HIGH (ref 0–200)
HDL: 45.8 mg/dL (ref >39.00)
LDL Cholesterol: 195 mg/dL — ABNORMAL HIGH (ref 0–99)
NonHDL: 228.11
Total CHOL/HDL Ratio: 6
Triglycerides: 167 mg/dL — ABNORMAL HIGH (ref 0.0–149.0)
VLDL: 33.4 mg/dL (ref 0.0–40.0)

## 2021-08-03 LAB — HEPATIC FUNCTION PANEL
ALT: 51 U/L (ref 0–53)
AST: 26 U/L (ref 0–37)
Albumin: 4.9 g/dL (ref 3.5–5.2)
Alkaline Phosphatase: 74 U/L (ref 39–117)
Bilirubin, Direct: 0.2 mg/dL (ref 0.0–0.3)
Total Bilirubin: 1.8 mg/dL — ABNORMAL HIGH (ref 0.2–1.2)
Total Protein: 7.9 g/dL (ref 6.0–8.3)

## 2021-08-03 LAB — HEMOGLOBIN A1C: Hgb A1c MFr Bld: 6.3 % (ref 4.6–6.5)

## 2021-10-17 ENCOUNTER — Telehealth: Payer: Self-pay | Admitting: Pulmonary Disease

## 2021-10-17 NOTE — Telephone Encounter (Signed)
I have called and LM on VM for the pt.  When he calls back he can be scheduled to see SG or RA.  thanks

## 2021-10-24 ENCOUNTER — Other Ambulatory Visit: Payer: Self-pay

## 2021-10-24 DIAGNOSIS — G4733 Obstructive sleep apnea (adult) (pediatric): Secondary | ICD-10-CM

## 2021-10-25 ENCOUNTER — Ambulatory Visit: Payer: BC Managed Care – PPO | Admitting: Acute Care

## 2021-11-07 ENCOUNTER — Institutional Professional Consult (permissible substitution): Payer: BC Managed Care – PPO | Admitting: Pulmonary Disease

## 2021-11-15 ENCOUNTER — Encounter: Payer: Self-pay | Admitting: Pulmonary Disease

## 2021-11-15 ENCOUNTER — Ambulatory Visit (INDEPENDENT_AMBULATORY_CARE_PROVIDER_SITE_OTHER): Payer: BC Managed Care – PPO | Admitting: Pulmonary Disease

## 2021-11-15 ENCOUNTER — Other Ambulatory Visit: Payer: Self-pay

## 2021-11-15 VITALS — BP 134/72 | HR 89 | Temp 98.3°F | Ht 64.0 in | Wt 201.0 lb

## 2021-11-15 DIAGNOSIS — G4733 Obstructive sleep apnea (adult) (pediatric): Secondary | ICD-10-CM

## 2021-11-15 NOTE — Progress Notes (Signed)
Steven Powell    [id number]    1981/01/26  Primary Care Physician:Blyth, Bonnita Levan, MD  Referring Physician: Mosie Lukes, MD [facility] [place],  [place] [address]  Chief complaint:   Patient with a history of obstructive sleep apnea  HPI:  Machine has become very loud and dysfunctional Has been trying to use his BiPAP machine to make sure he gets good sleep  Tolerate CPAP well  Wakes up feeling like is at a good nights rest Uses melatonin in the evening to try and get to sleep  We do not have a download from his machine today has not been working well  Usually goes to bed between 1130 and 1 AM About 50 minutes to fall asleep About 2 awakenings Final wake up time about 8 AM  Weight is up about 18 pounds  History of hypertension-controlled  Dad did snore, no other family members with obstructive sleep apnea Occasional dryness of his mouth No headaches No daytime sleepiness  Outpatient Encounter Medications as of 11/15/2021  Medication Sig   allopurinol (ZYLOPRIM) 100 MG tablet Take 2 tablets by mouth daily.   colchicine 0.6 MG tablet TAKE 1 TABLET BY MOUTH EVERY DAY   Probiotic Product (PROBIOTIC-10 PO) Take by mouth.   Red Yeast Rice Extract (RED YEAST RICE PO) Take by mouth.   No facility-administered encounter medications on file as of 11/15/2021.    Allergies as of 11/15/2021   (No Known Allergies)    Past Medical History:  Diagnosis Date   Allergy    seasonal mostly in spring   Asthma    childhood   Diabetes mellitus type 2, controlled ([place]) 10/01/2017   Gout    left knee   Hyperlipidemia    Hyperlipidemia associated with type 2 diabetes mellitus ([place]) 10/06/2017   Preventative health care 10/06/2017   Pseudogout    left knee    Past Surgical History:  Procedure Laterality Date   KNEE SURGERY     KNEE SURGERY  2014   arthroscopy left knee, found pseudogout, and meniscal tea    Family History  Problem Relation  Age of Onset   Diabetes Other    Hyperlipidemia Other    Hypertension Other    Hypertension Mother    Diabetes Father    Heart disease Father    Asthma Daughter    Stroke Maternal Grandmother    Stroke Maternal Grandfather     Social History   Socioeconomic History   Marital status: Married    Spouse name: Not on file   Number of children: Not on file   Years of education: Not on file   Highest education level: Not on file  Occupational History   Not on file  Tobacco Use   Smoking status: Never   Smokeless tobacco: Never  Substance and Sexual Activity   Alcohol use: Yes    Alcohol/week: 0.0 standard drinks   Drug use: No   Sexual activity: Not on file  Other Topics Concern   Not on file  Social History Narrative   Occupation: currently working for [employer]   Lives with wife and mom   Never Smoked    Alcohol use-yes (social)   No dietary restrictions, intermittent fasting          Social Determinants of Health   Financial Resource Strain: Not on file  Food Insecurity: Not on file  Transportation Needs: Not on file  Physical Activity: Not on file  Stress: Not on file  Social Connections: Not on file  Intimate Partner Violence: Not on file    Review of Systems  Constitutional:  Negative for fatigue.  Respiratory:  Positive for apnea. Negative for shortness of breath.   Psychiatric/Behavioral:  Positive for sleep disturbance.    Vitals:   11/15/21 1133  BP: 134/72  Pulse: 89  Temp: 98.3 F (36.8 C)  SpO2: 99%     Physical Exam Constitutional:      Appearance: He is obese.  HENT:     Head: Normocephalic.     Nose: Nose normal.     Mouth/Throat:     Mouth: Mucous membranes are moist.     Comments: Crowded oropharynx, Mallampati 3, macroglossia Cardiovascular:     Rate and Rhythm: Normal rate and regular rhythm.     Heart sounds: No murmur heard.   No friction rub.  Pulmonary:     Effort: No respiratory distress.     Breath sounds: No stridor. No  wheezing or rhonchi.  Musculoskeletal:     Cervical back: No rigidity or tenderness.  Neurological:     Mental Status: He is alert.  Psychiatric:        Mood and Affect: Mood normal.    Results of the Epworth flowsheet 11/15/2021  Sitting and reading 0  Watching TV 0  Sitting, inactive in a public place (e.g. a theatre or a meeting) 0  As a passenger in a car for an hour without a break 0  Lying down to rest in the afternoon when circumstances permit 0  Sitting and talking to someone 2  Sitting quietly after a lunch without alcohol 0  In a car, while stopped for a few minutes in traffic 0  Total score 2    Data Reviewed: Sleep study from 12/27/2017 reviewed showing severe obstructive sleep apnea  Assessment:  Obstructive sleep apnea  Compliant with CPAP  Dysfunctional machine  Patient is compliant with CPAP use Uses CPAP on a nightly basis Continues to benefit from CPAP  Had to revert to using a backup machine that is not working efficiently because his current machine is extremely noisy and not allowing him enough rest  Plan/Recommendations:  DME referral for new CPAP  Auto titrating CPAP 5-15  Encouraged to use CPAP on a nightly basis  Weight loss efforts encouraged  Will maintain yearly follow-up as long as he is doing well   Sherrilyn Rist MD [facility] 11/15/2021, 11:46 AM  CC: Mosie Lukes, MD

## 2021-11-15 NOTE — Patient Instructions (Signed)
We will send a prescription to [facility] for a new CPAP  Auto titrating CPAP 5-15  Tentative follow-up in a year as long as you are doing well  Call with any significant concerns

## 2021-11-20 ENCOUNTER — Ambulatory Visit: Payer: BC Managed Care – PPO | Admitting: Pulmonary Disease

## 2021-11-21 NOTE — Telephone Encounter (Signed)
Pt was seen for an appt by Dr. Jenetta Downer 11/16. Closing encounter.

## 2022-01-02 ENCOUNTER — Other Ambulatory Visit: Payer: Self-pay | Admitting: Family

## 2022-02-04 NOTE — Progress Notes (Addendum)
[facility] [address] [place], [place] [address] [phone]  Date:  02/07/2022   Name:  Steven Powell   DOB:  1981-09-22   MRN:  [id number]  PCP:  Mosie Lukes, MD    Chief Complaint: 6 month follow up (Concerns/ questions: Izora Gala eye exam:/Hep C screen due/Foot exam due today)   History of Present Illness:  Steven Powell is a 41 y.o. very pleasant male patient who presents with the following:  Primary patient of my partner Dr. Charlett Blake, here today for periodic follow-up I also saw him at last visit in August-history of prediabetes, hyperlipidemia At her last visit he was concerned about recent blood pressure elevation noted at an urgent care visit.  However, he had been checking his blood pressures regularly and they were most often in normal range. We recommended continue to work on exercise and weight loss  COVID booster Chart is not quite clear prediabetes versus diabetes-there is only one A1c greater than 6.5 that I can find.  We will ask patient to clarify- per our conversation his true dx is still prediabetes   He has been working on exercise- he notes more success over the last 6 weeks or so.  He got back to the gym just recently and is working out 5x a week- mostly doing Editor, commissioning.  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   Dyslipidemia 10/06/2017   Left knee pain 10/01/2017   Prediabetes 10/01/2017   OSA (obstructive sleep apnea) 10/01/2017   Gout    Reactive airway disease 04/19/2011    Past Medical History:  Diagnosis Date   Allergy    seasonal mostly in spring   Asthma    childhood   Diabetes mellitus type 2, controlled ([place]) 10/01/2017   Gout    left knee   Hyperlipidemia    Hyperlipidemia associated with type 2 diabetes mellitus ([place]) 10/06/2017   Preventative health care 10/06/2017   Pseudogout    left knee    Past Surgical History:  Procedure Laterality Date   KNEE SURGERY     KNEE SURGERY  2014   arthroscopy left knee, found pseudogout, and meniscal tea    Social History   Tobacco Use   Smoking status: Never   Smokeless tobacco: Never  Substance Use Topics   Alcohol use: Yes    Alcohol/week: 0.0 standard drinks   Drug use: No    Family History  Problem Relation Age of Onset   Diabetes Other    Hyperlipidemia Other    Hypertension Other    Hypertension Mother    Diabetes Father    Heart disease Father    Asthma Daughter    Stroke Maternal Grandmother  Stroke Maternal Grandfather     No Known Allergies  Medication list has been reviewed and updated.  Current Outpatient Medications on File Prior to Visit  Medication Sig Dispense Refill   allopurinol (ZYLOPRIM) 100 MG tablet TAKE 2 TABLETS DAILY 180 tablet 3   colchicine 0.6 MG tablet TAKE 1 TABLET BY MOUTH EVERY DAY 90 tablet 1   Probiotic Product (PROBIOTIC-10 PO) Take by mouth.     Red Yeast Rice Extract (RED YEAST RICE PO) Take by mouth.     No current facility-administered medications on file prior to visit.    Review of Systems:  As per HPI- otherwise negative.   Physical Examination: Vitals:   02/07/22 1353 02/07/22 1412  BP: 140/80 130/85  Pulse: 100 90  Resp: 16   Temp: 98.4 F (36.9 C)   SpO2: 98%    Vitals:   02/07/22 1353  Weight: 206 lb 9.6 oz (93.7 kg)  Height: 5\' 4"   (1.626 m)   Body mass index is 35.46 kg/m. Ideal Body Weight: Weight in (lb) to have BMI = 25: 145.3  GEN: no acute distress. Overweight, muscular build.  Looks well  HEENT: Atraumatic, Normocephalic.  Ears and Nose: No external deformity. CV: RRR, No M/G/R. No JVD. No thrill. No extra heart sounds. PULM: CTA B, no wheezes, crackles, rhonchi. No retractions. No resp. distress. No accessory muscle use. EXTR: No c/c/e PSYCH: Normally interactive. Conversant.    Assessment and Plan: Prediabetes - Plan: Comprehensive metabolic panel, Hemoglobin A1c  Dyslipidemia - Plan: Lipid panel  OSA (obstructive sleep apnea)  Elevated BP without diagnosis of hypertension - Plan: CBC, Comprehensive metabolic panel  Idiopathic gout, unspecified chronicity, unspecified site - Plan: Uric acid  Following up today Encouraged him to continue working on weight control and exercise BP is reasonable  His gout is well controlled with allopurinol- check uric acid  Will plan further follow- up pending labs.   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, back to 2009 Patient also has long history of mildly elevated ALT Results for orders placed or performed in visit on 02/07/22  CBC  Result Value Ref Range   WBC 7.9 4.0 - 10.5 K/uL   RBC 5.02 4.22 - 5.81 Mil/uL   Platelets 330.0 150.0 - 400.0 K/uL   Hemoglobin 15.3 13.0 - 17.0 g/dL   HCT 46.2 39.0 - 52.0 %   MCV 91.9 78.0 - 100.0 fl   MCHC 33.1 30.0 - 36.0 g/dL   RDW 13.6 11.5 - 15.5 %  Comprehensive metabolic panel  Result Value Ref Range   Sodium 139 135 - 145 mEq/L   Potassium 4.5 3.5 - 5.1 mEq/L   Chloride 102 96 - 112 mEq/L   CO2 29 19 - 32 mEq/L   Glucose, Bld 99 70 - 99 mg/dL   BUN 18 6 - 23 mg/dL   Creatinine, Ser 1.24 0.40 - 1.50 mg/dL   Total Bilirubin 1.7 (H) 0.2 - 1.2 mg/dL   Alkaline Phosphatase 67 39 - 117 U/L   AST 32 0 - 37 U/L   ALT 72 (H) 0 -  53 U/L   Total Protein 7.5 6.0 - 8.3 g/dL   Albumin 4.6 3.5 - 5.2 g/dL   GFR 73.60 >60.00 mL/min   Calcium 9.6 8.4 - 10.5 mg/dL  Hemoglobin A1c  Result Value Ref Range   Hgb A1c MFr Bld 6.3 4.6 - 6.5 %  Lipid panel  Result  Value Ref Range   Cholesterol 217 (H) 0 - 200 mg/dL   Triglycerides 187.0 (H) 0.0 - 149.0 mg/dL   HDL 42.70 >39.00 mg/dL   VLDL 37.4 0.0 - 40.0 mg/dL   LDL Cholesterol 136 (H) 0 - 99 mg/dL   Total CHOL/HDL Ratio 5    NonHDL 173.80   Uric acid  Result Value Ref Range   Uric Acid, Serum 7.7 4.0 - 7.8 mg/dL

## 2022-02-04 NOTE — Patient Instructions (Addendum)
Good to see you again today!   I will be in touch with your labs asap  Xenia job with exercise!  Please tell Guadelupe Sabin I recommend couples work-outs and walks:)    Take care

## 2022-02-07 ENCOUNTER — Encounter: Payer: Self-pay | Admitting: Family Medicine

## 2022-02-07 ENCOUNTER — Ambulatory Visit: Payer: BC Managed Care – PPO | Admitting: Family Medicine

## 2022-02-07 VITALS — BP 130/85 | HR 90 | Temp 98.4°F | Resp 16 | Ht 64.0 in | Wt 206.6 lb

## 2022-02-07 DIAGNOSIS — E785 Hyperlipidemia, unspecified: Secondary | ICD-10-CM

## 2022-02-07 DIAGNOSIS — G4733 Obstructive sleep apnea (adult) (pediatric): Secondary | ICD-10-CM | POA: Diagnosis not present

## 2022-02-07 DIAGNOSIS — R030 Elevated blood-pressure reading, without diagnosis of hypertension: Secondary | ICD-10-CM | POA: Diagnosis not present

## 2022-02-07 DIAGNOSIS — R7303 Prediabetes: Secondary | ICD-10-CM | POA: Diagnosis not present

## 2022-02-07 DIAGNOSIS — R17 Unspecified jaundice: Secondary | ICD-10-CM

## 2022-02-07 DIAGNOSIS — M1000 Idiopathic gout, unspecified site: Secondary | ICD-10-CM | POA: Diagnosis not present

## 2022-02-07 DIAGNOSIS — R7401 Elevation of levels of liver transaminase levels: Secondary | ICD-10-CM

## 2022-02-07 LAB — COMPREHENSIVE METABOLIC PANEL
ALT: 72 U/L — ABNORMAL HIGH (ref 0–53)
AST: 32 U/L (ref 0–37)
Albumin: 4.6 g/dL (ref 3.5–5.2)
Alkaline Phosphatase: 67 U/L (ref 39–117)
BUN: 18 mg/dL (ref 6–23)
CO2: 29 mEq/L (ref 19–32)
Calcium: 9.6 mg/dL (ref 8.4–10.5)
Chloride: 102 mEq/L (ref 96–112)
Creatinine, Ser: 1.24 mg/dL (ref 0.40–1.50)
GFR: 73.6 mL/min (ref >60.00)
Glucose, Bld: 99 mg/dL (ref 70–99)
Potassium: 4.5 mEq/L (ref 3.5–5.1)
Sodium: 139 mEq/L (ref 135–145)
Total Bilirubin: 1.7 mg/dL — ABNORMAL HIGH (ref 0.2–1.2)
Total Protein: 7.5 g/dL (ref 6.0–8.3)

## 2022-02-07 LAB — LIPID PANEL
Cholesterol: 217 mg/dL — ABNORMAL HIGH (ref 0–200)
HDL: 42.7 mg/dL (ref >39.00)
LDL Cholesterol: 136 mg/dL — ABNORMAL HIGH (ref 0–99)
NonHDL: 173.8
Total CHOL/HDL Ratio: 5
Triglycerides: 187 mg/dL — ABNORMAL HIGH (ref 0.0–149.0)
VLDL: 37.4 mg/dL (ref 0.0–40.0)

## 2022-02-07 LAB — CBC
HCT: 46.2 % (ref 39.0–52.0)
Hemoglobin: 15.3 g/dL (ref 13.0–17.0)
MCHC: 33.1 g/dL (ref 30.0–36.0)
MCV: 91.9 fl (ref 78.0–100.0)
Platelets: 330 10*3/uL (ref 150.0–400.0)
RBC: 5.02 Mil/uL (ref 4.22–5.81)
RDW: 13.6 % (ref 11.5–15.5)
WBC: 7.9 10*3/uL (ref 4.0–10.5)

## 2022-02-07 LAB — URIC ACID: Uric Acid, Serum: 7.7 mg/dL (ref 4.0–7.8)

## 2022-02-07 LAB — HEMOGLOBIN A1C: Hgb A1c MFr Bld: 6.3 % (ref 4.6–6.5)

## 2022-02-07 NOTE — Addendum Note (Signed)
Addended by: Darreld Mclean on: 02/07/2022 08:23 PM   Modules accepted: Orders

## 2022-02-14 ENCOUNTER — Encounter: Payer: Self-pay | Admitting: Family Medicine

## 2022-02-14 ENCOUNTER — Other Ambulatory Visit: Payer: Self-pay

## 2022-02-14 ENCOUNTER — Ambulatory Visit (HOSPITAL_BASED_OUTPATIENT_CLINIC_OR_DEPARTMENT_OTHER)
Admission: RE | Admit: 2022-02-14 | Discharge: 2022-02-14 | Disposition: A | Discharge status: Home / Self Care | Payer: BC Managed Care – PPO | Source: Ambulatory Visit | Attending: Family Medicine | Admitting: Family Medicine

## 2022-02-14 DIAGNOSIS — R7401 Elevation of levels of liver transaminase levels: Secondary | ICD-10-CM | POA: Diagnosis present

## 2022-02-14 DIAGNOSIS — R17 Unspecified jaundice: Secondary | ICD-10-CM | POA: Insufficient documentation

## 2022-03-01 ENCOUNTER — Ambulatory Visit: Payer: BC Managed Care – PPO | Admitting: Family

## 2022-03-01 ENCOUNTER — Encounter: Payer: Self-pay | Admitting: Family

## 2022-03-01 VITALS — BP 130/100 | HR 105 | Temp 98.2°F | Resp 18 | Ht 64.0 in | Wt 211.2 lb

## 2022-03-01 DIAGNOSIS — R079 Chest pain, unspecified: Secondary | ICD-10-CM

## 2022-03-01 DIAGNOSIS — R0989 Other specified symptoms and signs involving the circulatory and respiratory systems: Secondary | ICD-10-CM | POA: Diagnosis not present

## 2022-03-01 DIAGNOSIS — R002 Palpitations: Secondary | ICD-10-CM | POA: Diagnosis not present

## 2022-03-01 NOTE — Progress Notes (Signed)
?Steven Powell is a 41 y.o. male with the following history as recorded in EpicCare:  ?Patient Active Problem List  ? Diagnosis Date Noted  ? Obesity (BMI 30-39.9) 07/07/2018  ? Acid reflux 07/07/2018  ? Right shoulder pain 01/02/2018  ? 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  ? Dyslipidemia 10/06/2017  ? Left knee pain 10/01/2017  ? Prediabetes 10/01/2017  ? OSA (obstructive sleep apnea) 10/01/2017  ? Gout   ? Reactive airway disease 04/19/2011  ?  ?Current Outpatient Medications  ?Medication Sig Dispense Refill  ? allopurinol (ZYLOPRIM) 100 MG tablet TAKE 2 TABLETS DAILY 180 tablet 3  ? colchicine 0.6 MG tablet TAKE 1 TABLET BY MOUTH EVERY DAY 90 tablet 1  ? Probiotic Product (PROBIOTIC-10 PO) Take by mouth.    ? Red Yeast Rice Extract (RED YEAST RICE PO) Take by mouth.    ? ?No current facility-administered medications for this visit.  ?  ?Allergies: Patient has no known allergies.  ?Past Medical History:  ?Diagnosis Date  ? Allergy   ? seasonal mostly in spring  ? Asthma   ? childhood  ? Diabetes mellitus type 2, controlled ([place]) 10/01/2017  ? Gout   ? left knee  ? Hyperlipidemia   ? Hyperlipidemia associated with type 2 diabetes mellitus ([place]) 10/06/2017  ? Preventative health care 10/06/2017  ? Pseudogout   ? left knee  ?  ?Past Surgical History:  ?Procedure Laterality Date  ? KNEE SURGERY    ? KNEE SURGERY  2014  ? arthroscopy left knee, found pseudogout, and meniscal tea  ?  ?Family History  ?Problem Relation Age of Onset  ? Diabetes Other   ? Hyperlipidemia Other   ? Hypertension Other   ? Hypertension Mother   ? Diabetes Father   ? Heart disease Father   ? Asthma Daughter   ? Stroke Maternal Grandmother   ? Stroke Maternal Grandfather   ?  ?Social History  ? ?Tobacco Use  ? Smoking status: Never  ? Smokeless tobacco: Never  ?Substance Use Topics  ? Alcohol use: Yes  ?  Alcohol/week: 0.0 standard drinks  ?  ?Subjective:  ? ?Patient is accompanied by his wife today; complaining of recurrent sensation of "air  hunger/ sensation that air is trapped in upper throat." Notes that symptoms have been present for "a while" but do seem more noticeable recently; no chest pain or shortness of breath on exertion; notes that his upper chest is actually very sore today due to recent exercise regimen and will be going to see chiropractor after his visit here today;  ?Did drink an energy drink earlier today; does drink energy drinks on a regular basis;  ?Notes he was diagnosed with asthma in middle school but does not currently need any type of inhaler;  ?Blood pressure is noted to be elevated today but was normal when he had his 6 month check up with PCP 2 weeks ago;  ?No formal diagnosis of GERD- does take Tums; abdominal ultrasound 2 weeks ago showed fatty liver;  ? ? ? ? ?Objective:  ?Vitals:  ? 03/01/22 1436  ?BP: (!) 130/100  ?Pulse: (!) 105  ?Resp: 18  ?Temp: 98.2 ?F (36.8 ?C)  ?TempSrc: Oral  ?SpO2: 97%  ?Weight: 211 lb 3.2 oz (95.8 kg)  ?Height: 5\' 4"  (1.626 m)  ?  ?General: Well developed, well nourished, in no acute distress  ?Skin : Warm and dry.  ?Head: Normocephalic and atraumatic  ?Eyes: Sclera and conjunctiva clear; pupils round and reactive  to light; extraocular movements intact  ?Ears: External normal; canals clear; tympanic membranes normal  ?Oropharynx: Pink, supple. No suspicious lesions  ?Neck: Supple without thyromegaly, adenopathy  ?Lungs: Respirations unlabored; clear to auscultation bilaterally without wheeze, rales, rhonchi  ?CVS exam: normal rate and regular rhythm. Tachycardia noted;  ?Neurologic: Alert and oriented; speech intact; face symmetrical; moves all extremities well; CNII-XII intact without focal deficit  ? ?Assessment:  ?1. Chest pain, unspecified type   ?2. Palpitations   ?3. Air hunger   ?  ?Plan:  ?? If symptoms are related to energy drink intake/ increased caffeine intake; EKG shows sinus rhythm;  ?Will go ahead and refer to cardiology for further evaluation and possible holter placement;   ?Stressed need to stop drinking energy drinks; continue to monitor blood pressure at home- ? White coat component/ anxiety component as well;  ?? Underlying asthma or GERD component as well; depending on cardiology exam, to consider asthma evaluation and/or trial of PPI;  ? ?This visit occurred during the SARS-CoV-2 public health emergency.  Safety protocols were in place, including screening questions prior to the visit, additional usage of staff PPE, and extensive cleaning of exam room while observing appropriate contact time as indicated for disinfecting solutions.  ? ? ?No follow-ups on file.  ?Orders Placed This Encounter  ?Procedures  ? D-Dimer, Quantitative  ? Ambulatory referral to Cardiology  ?  Referral Priority:   Routine  ?  Referral Type:   Consultation  ?  Referral Reason:   Specialty Services Required  ?  Requested Specialty:   Cardiology  ?  Number of Visits Requested:   1  ? EKG 12-Lead  ?  ?Requested Prescriptions  ? ? No prescriptions requested or ordered in this encounter  ?  ? ?

## 2022-03-02 LAB — D-DIMER, QUANTITATIVE: D-Dimer, Quant: 0.23 mcg/mL FEU (ref <0.50)

## 2022-03-23 ENCOUNTER — Other Ambulatory Visit: Payer: Self-pay

## 2022-03-23 MED ORDER — LEVOCETIRIZINE DIHYDROCHLORIDE 5 MG PO TABS: 5.0000 mg | ORAL_TABLET | Freq: Every evening | ORAL | 1 refills | Status: DC

## 2022-03-27 ENCOUNTER — Ambulatory Visit: Payer: BC Managed Care – PPO | Admitting: Family

## 2022-03-27 ENCOUNTER — Other Ambulatory Visit (HOSPITAL_BASED_OUTPATIENT_CLINIC_OR_DEPARTMENT_OTHER): Payer: Self-pay

## 2022-03-27 VITALS — BP 153/109 | HR 92 | Temp 98.4°F | Resp 16 | Wt 211.0 lb

## 2022-03-27 DIAGNOSIS — I10 Essential (primary) hypertension: Secondary | ICD-10-CM | POA: Diagnosis not present

## 2022-03-27 DIAGNOSIS — S46011A Strain of muscle(s) and tendon(s) of the rotator cuff of right shoulder, initial encounter: Secondary | ICD-10-CM | POA: Diagnosis not present

## 2022-03-27 MED ORDER — AMLODIPINE BESYLATE 5 MG PO TABS
5.0000 mg | ORAL_TABLET | Freq: Every day | ORAL | 3 refills | Status: DC
  Filled 2022-03-27: qty 30, 30d supply, fill #0
  Filled 2022-04-24: qty 30, 30d supply, fill #1

## 2022-03-27 MED ORDER — MELOXICAM 7.5 MG PO TABS
7.5000 mg | ORAL_TABLET | Freq: Every day | ORAL | 0 refills | Status: DC
  Filled 2022-03-27: qty 14, 14d supply, fill #0

## 2022-03-27 NOTE — Progress Notes (Signed)
? ?Subjective:  ? ?By signing my name below, I, [name], attest that this documentation has been prepared under the direction and in the presence of Debbrah Alar NP, 03/27/2022 ? ? Patient ID: Steven Powell, male    DOB: 1981/04/12, 41 y.o.   MRN: [id number] ? ?Chief Complaint  ?Patient presents with  ? Shoulder Pain  ?  Patient complains of right shoulder pain  ? ? ?Shoulder Pain  ? ?Patient is in today for an office visit. ? ?Shoulder Pain - He complains of right shoulder pain that begun on 03/25/2022. Symptoms worsen when he pulls, raises or rotates his right arm. He stated that he went to see a massage therapist on 03/24/2022 after working out. His notes that his right shoulder is tender to the touch. He uses ice to help relieve his pain symptoms. ? ?Blood Pressure - As of today's visit, his blood pressure levels are elevated. ? ?BP Readings from Last 3 Encounters:  ?03/27/22 (!) 153/109  ?03/01/22 (!) 130/100  ?02/07/22 130/85  ? ? ? ? ?Health Maintenance Due  ?Topic Date Due  ? FOOT EXAM  Never done  ? OPHTHALMOLOGY EXAM  Never done  ? Hepatitis C Screening  Never done  ? COVID-19 Vaccine (3 - Pfizer risk series) 05/16/2020  ? ? ?Past Medical History:  ?Diagnosis Date  ? Allergy   ? seasonal mostly in spring  ? Asthma   ? childhood  ? Diabetes mellitus type 2, controlled ([place]) 10/01/2017  ? Gout   ? left knee  ? Hyperlipidemia   ? Hyperlipidemia associated with type 2 diabetes mellitus ([place]) 10/06/2017  ? Preventative health care 10/06/2017  ? Pseudogout   ? left knee  ? ? ?Past Surgical History:  ?Procedure Laterality Date  ? KNEE SURGERY    ? KNEE SURGERY  2014  ? arthroscopy left knee, found pseudogout, and meniscal tea  ? ? ?Family History  ?Problem Relation Age of Onset  ? Diabetes Other   ? Hyperlipidemia Other   ? Hypertension Other   ? Hypertension Mother   ? Diabetes Father   ? Heart disease Father   ? Asthma Daughter   ? Stroke Maternal Grandmother   ? Stroke Maternal Grandfather    ? ? ?Social History  ? ?Socioeconomic History  ? Marital status: Married  ?  Spouse name: Not on file  ? Number of children: Not on file  ? Years of education: Not on file  ? Highest education level: Not on file  ?Occupational History  ? Not on file  ?Tobacco Use  ? Smoking status: Never  ? Smokeless tobacco: Never  ?Substance and Sexual Activity  ? Alcohol use: Yes  ?  Alcohol/week: 0.0 standard drinks  ? Drug use: No  ? Sexual activity: Not on file  ?Other Topics Concern  ? Not on file  ?Social History Narrative  ? Occupation: currently working for [employer]  ? Lives with wife and mom  ? Never Smoked   ? Alcohol use-yes (social)  ? No dietary restrictions, intermittent fasting   ?   ?   ? ?Social Determinants of Health  ? ?Financial Resource Strain: Not on file  ?Food Insecurity: Not on file  ?Transportation Needs: Not on file  ?Physical Activity: Not on file  ?Stress: Not on file  ?Social Connections: Not on file  ?Intimate Partner Violence: Not on file  ? ? ?Outpatient Medications Prior to Visit  ?Medication Sig Dispense Refill  ? allopurinol (ZYLOPRIM) 100 MG  tablet TAKE 2 TABLETS DAILY 180 tablet 3  ? colchicine 0.6 MG tablet TAKE 1 TABLET BY MOUTH EVERY DAY 90 tablet 1  ? levocetirizine (XYZAL) 5 MG tablet Take 1 tablet (5 mg total) by mouth every evening. 90 tablet 1  ? Probiotic Product (PROBIOTIC-10 PO) Take by mouth.    ? Red Yeast Rice Extract (RED YEAST RICE PO) Take by mouth.    ? ?No facility-administered medications prior to visit.  ? ? ?No Known Allergies ? ?ROS ? ?   ?Objective:  ?  ?Physical Exam ?Constitutional:   ?   General: He is not in acute distress. ?   Appearance: Normal appearance. He is not ill-appearing.  ?HENT:  ?   Head: Normocephalic and atraumatic.  ?   Right Ear: External ear normal.  ?   Left Ear: External ear normal.  ?Eyes:  ?   Extraocular Movements: Extraocular movements intact.  ?   Pupils: Pupils are equal, round, and reactive to light.  ?Cardiovascular:  ?   Rate and Rhythm:  Normal rate and regular rhythm.  ?   Heart sounds: Normal heart sounds. No murmur heard. ?  No gallop.  ?Pulmonary:  ?   Effort: Pulmonary effort is normal. No respiratory distress.  ?   Breath sounds: Normal breath sounds. No wheezing or rales.  ?Musculoskeletal:  ?   Right shoulder: Tenderness present.  ?Skin: ?   General: Skin is warm and dry.  ?Neurological:  ?   Mental Status: He is alert and oriented to person, place, and time.  ?Psychiatric:     ?   Mood and Affect: Mood normal.     ?   Behavior: Behavior normal.     ?   Judgment: Judgment normal.  ? ? ?BP (!) 153/109 (BP Location: Right Arm, Patient Position: Sitting, Cuff Size: Large)   Pulse 92   Temp 98.4 ?F (36.9 ?C) (Oral)   Resp 16   Wt 211 lb (95.7 kg)   SpO2 100%   BMI 36.22 kg/m?  ?Wt Readings from Last 3 Encounters:  ?03/27/22 211 lb (95.7 kg)  ?03/01/22 211 lb 3.2 oz (95.8 kg)  ?02/07/22 206 lb 9.6 oz (93.7 kg)  ? ? ?   ?Assessment & Plan:  ? ?Problem List Items Addressed This Visit   ? ?  ? Unprioritized  ? Rotator cuff strain, right, initial encounter  ?  Recommended ice, meloxicam, tylenol prn. Call if symptoms worsen or if not improved in 2 weeks. Would plan referral to sports medicine at that time.  ?  ?  ? Hypertension - Primary  ?  BP Readings from Last 3 Encounters:  ?03/27/22 (!) 153/109  ?03/01/22 (!) 130/100  ?02/07/22 130/85  ?BP is uncontrolled. Discussed diet/exercise/weight loss (low sodium). Recommended that he add amlodipine 5 mg once daily.  ?  ?  ? Relevant Medications  ? amLODipine (NORVASC) 5 MG tablet  ? ? ? ? ?Meds ordered this encounter  ?Medications  ? meloxicam (MOBIC) 7.5 MG tablet  ?  Sig: Take 1 tablet (7.5 mg total) by mouth daily.  ?  Dispense:  14 tablet  ?  Refill:  0  ?  Order Specific Question:   Supervising Provider  ?  Answer:   Penni Homans A [[id number]]  ? amLODipine (NORVASC) 5 MG tablet  ?  Sig: Take 1 tablet (5 mg total) by mouth daily.  ?  Dispense:  30 tablet  ?  Refill:  3  ?  Order Specific Question:    Supervising Provider  ?  Answer:   Penni Homans A [[id number]]  ? ? ?I, Nance Pear, NP, personally preformed the services described in this documentation.  All medical record entries made by the scribe were at my direction and in my presence.  I have reviewed the chart and discharge instructions (if applicable) and agree that the record reflects my personal performance and is accurate and complete. 03/27/2022 ? ? ?I,Amber Collins,acting as a Education administrator for [employer], NP.,have documented all relevant documentation on the behalf of Nance Pear, NP,as directed by  Nance Pear, NP while in the presence of Nance Pear, NP.  ? ? ?Nance Pear, NP ? ?

## 2022-03-27 NOTE — Patient Instructions (Signed)
Please begin meloxicam once daily.   ?You may also add tylenol as needed for pain. ?Ice your right shoulder twice daily. ?Call if pain worsens or if not improved in 2 weeks and we will plan to refer you to sports medicine.  ?

## 2022-03-27 NOTE — Assessment & Plan Note (Addendum)
BP Readings from Last 3 Encounters:  ?03/27/22 (!) 153/109  ?03/01/22 (!) 130/100  ?02/07/22 130/85  ? ?BP is uncontrolled. Discussed diet/exercise/weight loss (low sodium). Recommended that he add amlodipine 5 mg once daily.  ?

## 2022-03-27 NOTE — Assessment & Plan Note (Signed)
Recommended ice, meloxicam, tylenol prn. Call if symptoms worsen or if not improved in 2 weeks. Would plan referral to sports medicine at that time.  ?

## 2022-04-03 ENCOUNTER — Other Ambulatory Visit (HOSPITAL_BASED_OUTPATIENT_CLINIC_OR_DEPARTMENT_OTHER): Payer: Self-pay

## 2022-04-03 ENCOUNTER — Other Ambulatory Visit: Payer: Self-pay

## 2022-04-03 MED ORDER — LEVOCETIRIZINE DIHYDROCHLORIDE 5 MG PO TABS
5.0000 mg | ORAL_TABLET | Freq: Every evening | ORAL | 1 refills | Status: DC
  Filled 2022-04-03: qty 90, 90d supply, fill #0

## 2022-04-11 ENCOUNTER — Ambulatory Visit: Payer: BC Managed Care – PPO | Admitting: Family

## 2022-04-11 DIAGNOSIS — I10 Essential (primary) hypertension: Secondary | ICD-10-CM | POA: Diagnosis not present

## 2022-04-11 NOTE — Assessment & Plan Note (Addendum)
BP Readings from Last 3 Encounters:  ?04/11/22 139/89  ?03/27/22 (!) 153/109  ?03/01/22 (!) 130/100  ? ?Wt Readings from Last 3 Encounters:  ?04/11/22 204 lb (92.5 kg)  ?03/27/22 211 lb (95.7 kg)  ?03/01/22 211 lb 3.2 oz (95.8 kg)  ? ?BP is stable/improved. Encouraged pt to continue his work on diet/exercise and weight loss.  ?

## 2022-04-11 NOTE — Progress Notes (Signed)
? ?Subjective:  ? ?By signing my name below, I, [name], attest that this documentation has been prepared under the direction and in the presence of Debbrah Alar, NP. 04/11/2022 ? ? ? Patient ID: Steven Powell, male    DOB: 05-29-1981, 41 y.o.   MRN: [id number] ? ?Chief Complaint  ?Patient presents with  ? Hypertension  ?  Here for follow up  ? ? ?Hypertension ? ?Patient is in today for a follow up visit.  ? ?Blood pressure- His blood pressure is doing well during this visit. During last visit he started taking 5 mg amlodipine daily PO and reports no new issues while taking it. He occasionally measures his blood pressure at home.  ?BP Readings from Last 3 Encounters:  ?04/11/22 139/89  ?03/27/22 (!) 153/109  ?03/01/22 (!) 130/100  ? ?Pulse Readings from Last 3 Encounters:  ?04/11/22 83  ?03/27/22 92  ?03/01/22 (!) 105  ? ?Weight- He continues trying to losing weight. He is participating in cardiovascular exercise for 1 hour daily with 50 minutes of strength training daily.  ?Wt Readings from Last 3 Encounters:  ?04/11/22 204 lb (92.5 kg)  ?03/27/22 211 lb (95.7 kg)  ?03/01/22 211 lb 3.2 oz (95.8 kg)  ? ? ?Health Maintenance Due  ?Topic Date Due  ? FOOT EXAM  Never done  ? OPHTHALMOLOGY EXAM  Never done  ? Hepatitis C Screening  Never done  ? COVID-19 Vaccine (3 - Pfizer risk series) 05/16/2020  ? ? ?Past Medical History:  ?Diagnosis Date  ? Allergy   ? seasonal mostly in spring  ? Asthma   ? childhood  ? Diabetes mellitus type 2, controlled ([place]) 10/01/2017  ? Gout   ? left knee  ? Hyperlipidemia   ? Hyperlipidemia associated with type 2 diabetes mellitus ([place]) 10/06/2017  ? Preventative health care 10/06/2017  ? Pseudogout   ? left knee  ? ? ?Past Surgical History:  ?Procedure Laterality Date  ? KNEE SURGERY    ? KNEE SURGERY  2014  ? arthroscopy left knee, found pseudogout, and meniscal tea  ? ? ?Family History  ?Problem Relation Age of Onset  ? Diabetes Other   ? Hyperlipidemia Other   ? Hypertension  Other   ? Hypertension Mother   ? Diabetes Father   ? Heart disease Father   ? Asthma Daughter   ? Stroke Maternal Grandmother   ? Stroke Maternal Grandfather   ? ? ?Social History  ? ?Socioeconomic History  ? Marital status: Married  ?  Spouse name: Not on file  ? Number of children: Not on file  ? Years of education: Not on file  ? Highest education level: Not on file  ?Occupational History  ? Not on file  ?Tobacco Use  ? Smoking status: Never  ? Smokeless tobacco: Never  ?Substance and Sexual Activity  ? Alcohol use: Yes  ?  Alcohol/week: 0.0 standard drinks  ? Drug use: No  ? Sexual activity: Not on file  ?Other Topics Concern  ? Not on file  ?Social History Narrative  ? Occupation: currently working for [employer]  ? Lives with wife and mom  ? Never Smoked   ? Alcohol use-yes (social)  ? No dietary restrictions, intermittent fasting   ?   ?   ? ?Social Determinants of Health  ? ?Financial Resource Strain: Not on file  ?Food Insecurity: Not on file  ?Transportation Needs: Not on file  ?Physical Activity: Not on file  ?Stress: Not on file  ?  Social Connections: Not on file  ?Intimate Partner Violence: Not on file  ? ? ?Outpatient Medications Prior to Visit  ?Medication Sig Dispense Refill  ? allopurinol (ZYLOPRIM) 100 MG tablet TAKE 2 TABLETS DAILY 180 tablet 3  ? amLODipine (NORVASC) 5 MG tablet Take 1 tablet (5 mg total) by mouth daily. 30 tablet 3  ? colchicine 0.6 MG tablet TAKE 1 TABLET BY MOUTH EVERY DAY 90 tablet 1  ? levocetirizine (XYZAL) 5 MG tablet Take 1 tablet (5 mg total) by mouth every evening. 90 tablet 1  ? meloxicam (MOBIC) 7.5 MG tablet Take 1 tablet (7.5 mg total) by mouth daily. 14 tablet 0  ? Probiotic Product (PROBIOTIC-10 PO) Take by mouth.    ? Red Yeast Rice Extract (RED YEAST RICE PO) Take by mouth.    ? ?No facility-administered medications prior to visit.  ? ? ?No Known Allergies ? ?ROS ? ?   ?Objective:  ?  ?Physical Exam ?Constitutional:   ?   General: He is not in acute distress. ?    Appearance: Normal appearance. He is not ill-appearing.  ?HENT:  ?   Head: Normocephalic and atraumatic.  ?   Right Ear: External ear normal.  ?   Left Ear: External ear normal.  ?Eyes:  ?   Extraocular Movements: Extraocular movements intact.  ?   Pupils: Pupils are equal, round, and reactive to light.  ?Cardiovascular:  ?   Rate and Rhythm: Normal rate and regular rhythm.  ?   Heart sounds: Normal heart sounds. No murmur heard. ?  No gallop.  ?Pulmonary:  ?   Effort: Pulmonary effort is normal. No respiratory distress.  ?   Breath sounds: Normal breath sounds. No wheezing or rales.  ?Skin: ?   General: Skin is warm and dry.  ?Neurological:  ?   Mental Status: He is alert and oriented to person, place, and time.  ?Psychiatric:     ?   Judgment: Judgment normal.  ? ? ?BP 139/89 (BP Location: Left Arm, Patient Position: Sitting, Cuff Size: Large)   Pulse 83   Temp 98.4 ?F (36.9 ?C) (Oral)   Resp 16   Wt 204 lb (92.5 kg)   SpO2 100%   BMI 35.02 kg/m?  ?Wt Readings from Last 3 Encounters:  ?04/11/22 204 lb (92.5 kg)  ?03/27/22 211 lb (95.7 kg)  ?03/01/22 211 lb 3.2 oz (95.8 kg)  ? ? ?   ?Assessment & Plan:  ? ?Problem List Items Addressed This Visit   ? ?  ? Unprioritized  ? Hypertension  ?  BP Readings from Last 3 Encounters:  ?04/11/22 139/89  ?03/27/22 (!) 153/109  ?03/01/22 (!) 130/100  ? ?Wt Readings from Last 3 Encounters:  ?04/11/22 204 lb (92.5 kg)  ?03/27/22 211 lb (95.7 kg)  ?03/01/22 211 lb 3.2 oz (95.8 kg)  ?BP is stable/improved. Encouraged pt to continue his work on diet/exercise and weight loss.  ?  ?  ? ? ? ?No orders of the defined types were placed in this encounter. ? ? ?I, Nance Pear, NP, personally preformed the services described in this documentation.  All medical record entries made by the scribe were at my direction and in my presence.  I have reviewed the chart and discharge instructions (if applicable) and agree that the record reflects my personal performance and is accurate  and complete. 04/11/2022 ? ? ?Engineering geologist as a Education administrator for [employer], NP.,have documented all relevant documentation on the behalf  of Nance Pear, NP,as directed by  Nance Pear, NP while in the presence of Nance Pear, NP. ? ? ?Nance Pear, NP ? ?

## 2022-04-18 ENCOUNTER — Ambulatory Visit: Payer: BC Managed Care – PPO | Admitting: Family

## 2022-04-24 ENCOUNTER — Other Ambulatory Visit (HOSPITAL_BASED_OUTPATIENT_CLINIC_OR_DEPARTMENT_OTHER): Payer: Self-pay

## 2022-05-22 ENCOUNTER — Other Ambulatory Visit: Payer: Self-pay

## 2022-05-22 MED ORDER — AMLODIPINE BESYLATE 5 MG PO TABS: 5.0000 mg | ORAL_TABLET | Freq: Every day | ORAL | 1 refills | Status: DC

## 2022-05-23 ENCOUNTER — Other Ambulatory Visit (HOSPITAL_BASED_OUTPATIENT_CLINIC_OR_DEPARTMENT_OTHER): Payer: Self-pay

## 2022-05-23 ENCOUNTER — Other Ambulatory Visit: Payer: Self-pay | Admitting: Family

## 2022-05-25 ENCOUNTER — Other Ambulatory Visit (HOSPITAL_BASED_OUTPATIENT_CLINIC_OR_DEPARTMENT_OTHER): Payer: Self-pay

## 2022-05-25 ENCOUNTER — Other Ambulatory Visit: Payer: Self-pay

## 2022-05-25 MED ORDER — AMLODIPINE BESYLATE 5 MG PO TABS
5.0000 mg | ORAL_TABLET | Freq: Every day | ORAL | 0 refills | Status: DC
  Filled 2022-05-25: qty 30, 30d supply, fill #0

## 2022-07-17 ENCOUNTER — Encounter: Payer: Self-pay | Admitting: Family

## 2022-07-17 ENCOUNTER — Ambulatory Visit (INDEPENDENT_AMBULATORY_CARE_PROVIDER_SITE_OTHER): Payer: BC Managed Care – PPO | Admitting: Family

## 2022-07-17 VITALS — BP 130/85 | HR 88 | Temp 98.4°F | Resp 16 | Ht 64.0 in | Wt 211.8 lb

## 2022-07-17 DIAGNOSIS — Z0000 Encounter for general adult medical examination without abnormal findings: Secondary | ICD-10-CM

## 2022-07-17 DIAGNOSIS — I10 Essential (primary) hypertension: Secondary | ICD-10-CM | POA: Diagnosis not present

## 2022-07-17 DIAGNOSIS — Z1159 Encounter for screening for other viral diseases: Secondary | ICD-10-CM

## 2022-07-17 DIAGNOSIS — R7303 Prediabetes: Secondary | ICD-10-CM

## 2022-07-17 DIAGNOSIS — E785 Hyperlipidemia, unspecified: Secondary | ICD-10-CM

## 2022-07-17 NOTE — Progress Notes (Signed)
Subjective:   By signing my name below, I, Kellie Simmering, attest that this documentation has been prepared under the direction and in the presence of Debbrah Alar, NP 07/17/2022.    Patient ID: Steven Powell, male    DOB: [birth year], 41 y.o.   MRN: [id number]  Chief Complaint  Patient presents with   Annual Exam         HPI Patient is in today for a comprehensive physical exam. His wife is with him today.  He denies having any fever, new moles, congestion, sinus pain, sore throat, chest pain, palpitations, cough, shortness of breath, wheezing, nausea, vomiting, diarrhea, constipation, dysuria, frequency, abdominal pain, hematuria, new muscle pain, new joint pain, headaches.  Hepatitis C- He is interested in hepatitis C screening.   Sleep apnea- He reports that he uses a CPAP to manage his sleep apnea. He states that he no longer snores.   Blood pressure-  His repeat blood pressure is within normal range today. 07/17/22 = 130/85 BP Readings from Last 3 Encounters:  07/17/22 130/85  04/11/22 139/89  03/27/22 (!) 153/109   Pulse Readings from Last 3 Encounters:  07/17/22 88  04/11/22 83  03/27/22 92   Family history: He is expecting a child in 11/2022. He reports no changes to his family history. His mother has hypertension. His father had diabetes and hypertension. His maternal grandparents had a history of strokes.  Social history: He reports that he drinks almost no alcoholic beverages. He does not consume tobacco or vape products nor drugs.  Immunizations: He received a tetanus immunization (Tdap) in 2021. He has received 2 pfizer COVID-19 vaccinations.  Diet: He reports that his diet is well managed and he underwent a 3 month low-carbohydrate diet.  Lab Results  Component Value Date   HGBA1C 6.3 02/07/2022   Exercise: He consistently works out at [employer].  Past Medical History:  Diagnosis Date   Allergy    seasonal mostly in spring   Asthma    childhood    Diabetes mellitus type 2, controlled ([place]) 10/01/2017   Gout    left knee   Hyperlipidemia    Hyperlipidemia associated with type 2 diabetes mellitus ([place]) 10/06/2017   Preventative health care 10/06/2017   Pseudogout    left knee    Past Surgical History:  Procedure Laterality Date   KNEE SURGERY     KNEE SURGERY  2014   arthroscopy left knee, found pseudogout, and meniscal tea    Family History  Problem Relation Age of Onset   Hypertension Mother    Diabetes Father    Heart disease Father    Gout Brother    Hypertension Brother    Stroke Maternal Grandmother    Stroke Maternal Grandfather    Asthma Daughter    Diabetes Other    Hyperlipidemia Other    Hypertension Other     Social History   Socioeconomic History   Marital status: Married    Spouse name: Not on file   Number of children: Not on file   Years of education: Not on file   Highest education level: Not on file  Occupational History   Not on file  Tobacco Use   Smoking status: Never   Smokeless tobacco: Never  Substance and Sexual Activity   Alcohol use: Yes    Alcohol/week: 0.0 standard drinks of alcohol   Drug use: No   Sexual activity: Yes    Partners: Female  Other Topics Concern  Not on file  Social History Narrative   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 30  tablet 0   colchicine 0.6 MG tablet TAKE 1 TABLET BY MOUTH EVERY DAY 90 tablet 1   levocetirizine (XYZAL) 5 MG tablet Take 1 tablet (5 mg total) by mouth every evening. 90 tablet 1   meloxicam (MOBIC) 7.5 MG tablet Take 1 tablet (7.5 mg total) by mouth daily. 14 tablet 0   Probiotic Product (PROBIOTIC-10 PO) Take by mouth.     Red Yeast Rice Extract (RED YEAST RICE PO) Take by mouth.     No facility-administered medications prior to visit.    No Known Allergies  Review of Systems  Constitutional:  Negative for chills and fever.  HENT:  Negative for congestion, sinus pain and sore throat.   Respiratory:  Negative for cough, sputum production, shortness of breath and wheezing.   Cardiovascular:  Negative for leg swelling.  Gastrointestinal:  Negative for abdominal pain, constipation, diarrhea, nausea and vomiting.  Genitourinary:  Negative for dysuria, frequency, hematuria and urgency.  Musculoskeletal:  Negative for myalgias.  Skin:  Negative for itching and rash.       (-) new moles  Neurological:  Negative for headaches.  Psychiatric/Behavioral:  Negative for depression.        Objective:    Physical Exam Constitutional:      General: He is not in acute distress.    Appearance: Normal appearance. He is not ill-appearing.  HENT:     Head: Normocephalic and atraumatic.     Right Ear: Tympanic membrane, ear canal and external ear normal.     Left Ear: Tympanic membrane, ear canal and external ear normal.  Eyes:     Extraocular Movements: Extraocular movements intact.     Right eye: No nystagmus.     Left eye: No nystagmus.     Pupils: Pupils are equal, round, and reactive to light.  Cardiovascular:     Rate and Rhythm: Normal rate and regular rhythm.     Pulses: Normal pulses.     Heart sounds: Normal heart sounds. No murmur heard.    No gallop.  Pulmonary:     Effort: Pulmonary effort is normal. No respiratory distress.     Breath sounds: Normal breath sounds. No  wheezing or rales.  Abdominal:     General: Bowel sounds are normal.     Palpations: Abdomen is soft.     Tenderness: There is no abdominal tenderness. There is no guarding.  Musculoskeletal:     Comments: Muscle strength 5/5 on upper and lower extremities  Skin:    General: Skin is warm and dry.  Neurological:     Mental Status: He is alert and oriented to person, place, and time.  Deep Tendon Reflexes:     Reflex Scores:      Patellar reflexes are 2+ on the right side and 2+ on the left side. Psychiatric:        Mood and Affect: Mood normal.        Behavior: Behavior normal.        Judgment: Judgment normal.        Assessment & Plan:   Problem List Items Addressed This Visit       Unprioritized   Preventative health care    Wt Readings from Last 3 Encounters:  07/17/22 211 lb 12.8 oz (96.1 kg)  04/11/22 204 lb (92.5 kg)  03/27/22 211 lb (95.7 kg)  Discussed healthy diet, exercise and weight loss.  Tetanus up to date. Recommended covid booster and flu shot this fall. He is expecting a baby in December.       Prediabetes   Relevant Orders   Hemoglobin A1c   Hypertension - Primary   Relevant Orders   Comp Met (CMET)   Dyslipidemia   Relevant Orders   Lipid panel   Other Visit Diagnoses     Need for hepatitis C screening test       Relevant Orders   Hepatitis C Antibody       No orders of the defined types were placed in this encounter.   I, Nance Pear, NP, personally preformed the services described in this documentation.  All medical record entries made by the scribe were at my direction and in my presence.  I have reviewed the chart and discharge instructions (if applicable) and agree that the record reflects my personal performance and is accurate and complete. 07/17/2022.  I,Mohammed Iqbal,acting as a Education administrator for [employer], NP.,have documented all relevant documentation on the behalf of Nance Pear, NP,as directed by   Nance Pear, NP while in the presence of Nance Pear, NP.  Nance Pear, NP

## 2022-07-17 NOTE — Assessment & Plan Note (Signed)
Wt Readings from Last 3 Encounters:  07/17/22 211 lb 12.8 oz (96.1 kg)  04/11/22 204 lb (92.5 kg)  03/27/22 211 lb (95.7 kg)   Discussed healthy diet, exercise and weight loss.  Tetanus up to date. Recommended covid booster and flu shot this fall. He is expecting a baby in December.

## 2022-07-18 LAB — LIPID PANEL
Cholesterol: 216 mg/dL — ABNORMAL HIGH (ref 0–200)
HDL: 47 mg/dL (ref >39.00)
LDL Cholesterol: 140 mg/dL — ABNORMAL HIGH (ref 0–99)
NonHDL: 169.39
Total CHOL/HDL Ratio: 5
Triglycerides: 146 mg/dL (ref 0.0–149.0)
VLDL: 29.2 mg/dL (ref 0.0–40.0)

## 2022-07-18 LAB — COMPREHENSIVE METABOLIC PANEL
ALT: 65 U/L — ABNORMAL HIGH (ref 0–53)
AST: 34 U/L (ref 0–37)
Albumin: 4.8 g/dL (ref 3.5–5.2)
Alkaline Phosphatase: 89 U/L (ref 39–117)
BUN: 12 mg/dL (ref 6–23)
CO2: 28 mEq/L (ref 19–32)
Calcium: 9.5 mg/dL (ref 8.4–10.5)
Chloride: 101 mEq/L (ref 96–112)
Creatinine, Ser: 1.15 mg/dL (ref 0.40–1.50)
GFR: 80.32 mL/min (ref >60.00)
Glucose, Bld: 109 mg/dL — ABNORMAL HIGH (ref 70–99)
Potassium: 4.4 mEq/L (ref 3.5–5.1)
Sodium: 139 mEq/L (ref 135–145)
Total Bilirubin: 1.3 mg/dL — ABNORMAL HIGH (ref 0.2–1.2)
Total Protein: 7.5 g/dL (ref 6.0–8.3)

## 2022-07-18 LAB — HEPATITIS C ANTIBODY: Hepatitis C Ab: NONREACTIVE

## 2022-07-18 LAB — HEMOGLOBIN A1C: Hgb A1c MFr Bld: 6.4 % (ref 4.6–6.5)

## 2022-07-26 ENCOUNTER — Other Ambulatory Visit (HOSPITAL_BASED_OUTPATIENT_CLINIC_OR_DEPARTMENT_OTHER): Payer: Self-pay

## 2022-09-06 ENCOUNTER — Encounter: Payer: BC Managed Care – PPO | Admitting: Family Medicine

## 2022-10-29 ENCOUNTER — Other Ambulatory Visit (HOSPITAL_BASED_OUTPATIENT_CLINIC_OR_DEPARTMENT_OTHER): Payer: Self-pay

## 2022-10-29 ENCOUNTER — Ambulatory Visit: Payer: BC Managed Care – PPO | Admitting: Family Medicine

## 2022-10-29 VITALS — BP 116/80 | HR 94 | Temp 97.8°F | Resp 18 | Ht 64.0 in | Wt 215.8 lb

## 2022-10-29 DIAGNOSIS — J020 Streptococcal pharyngitis: Secondary | ICD-10-CM | POA: Diagnosis not present

## 2022-10-29 DIAGNOSIS — R5381 Other malaise: Secondary | ICD-10-CM | POA: Diagnosis not present

## 2022-10-29 LAB — POCT INFLUENZA A/B
Influenza A, POC: NEGATIVE
Influenza B, POC: NEGATIVE

## 2022-10-29 LAB — POCT RAPID STREP A (OFFICE): Rapid Strep A Screen: POSITIVE — AB

## 2022-10-29 MED ORDER — CEFDINIR 300 MG PO CAPS
300.0000 mg | ORAL_CAPSULE | Freq: Two times a day (BID) | ORAL | 0 refills | Status: DC
  Filled 2022-10-29: qty 20, 10d supply, fill #0

## 2022-10-29 NOTE — Patient Instructions (Signed)
Treat for strep throat with omnicef twice daily for 10 days Please let me know if you are not feeling better in the next 1-2 days- Sooner if worse.

## 2022-10-29 NOTE — Progress Notes (Signed)
[facility] [address], [place], [place] [address] [phone]  Date:  10/29/2022   Name:  Steven Powell   DOB:  July 18, 1981   MRN:  [id number]  PCP:  Mosie Lukes, MD    Chief Complaint: URI (X Friday. Neg covid test today. Sxs include: congestion, sneezing, cough, muscle fatigue.)   History of Present Illness:  Steven Powell is a 41 y.o. very pleasant male patient who presents with the following:  Pt seen today with illness Generally in good health except for HTN, obesity and pre-diabetes, sleep apnea He notes onset of illness sx about 5 days ago Chest congestion-noted while he was doing circuit training at the gym last week He notes coughing, sneezing, sore throat He notes he will feel hot and cold He took some tylenol this am which did seem to help somewhat He notes body aches, he feels tired  Yesterday and today he was feeling worse again No GI symptoms He took a covid test this am and it was negative   Lab Results  Component Value Date   HGBA1C 6.4 07/17/2022     Patient Active Problem List   Diagnosis Date Noted   Hypertension 03/27/2022   Obesity (BMI 30-39.9) 07/07/2018   Acid reflux 07/07/2018   Rotator cuff strain, right, initial encounter 01/02/2018   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   Dyslipidemia 10/06/2017   Preventative health care 10/06/2017   Left knee pain 10/01/2017   Prediabetes 10/01/2017   OSA (obstructive sleep apnea) 10/01/2017   Gout    Reactive airway disease 04/19/2011    Past Medical History:  Diagnosis Date   Allergy    seasonal mostly in spring   Asthma    childhood   Diabetes mellitus type 2, controlled ([place]) 10/01/2017   Gout    left knee   Hyperlipidemia    Hyperlipidemia associated with type 2 diabetes mellitus ([place]) 10/06/2017   Preventative health care 10/06/2017   Pseudogout    left knee    Past Surgical History:  Procedure Laterality  Date   KNEE SURGERY     KNEE SURGERY  2014   arthroscopy left knee, found pseudogout, and meniscal tea    Social History   Tobacco Use   Smoking status: Never   Smokeless tobacco: Never  Substance Use Topics   Alcohol use: Yes    Alcohol/week: 0.0 standard drinks of alcohol   Drug use: No    Family History  Problem Relation Age of Onset   Hypertension Mother    Diabetes Father    Heart disease Father    Gout Brother    Hypertension Brother    Stroke Maternal Grandmother    Stroke Maternal Grandfather    Asthma Daughter    Diabetes Other    Hyperlipidemia Other    Hypertension Other     No Known Allergies  Medication list has been reviewed and updated.  Current Outpatient Medications on File Prior to Visit  Medication Sig Dispense Refill   allopurinol (ZYLOPRIM) 100 MG tablet TAKE 2 TABLETS DAILY 180 tablet 3   amLODipine (NORVASC) 5 MG tablet Take 1 tablet (5 mg total) by mouth daily. 30 tablet 0   colchicine 0.6 MG tablet TAKE 1 TABLET BY MOUTH EVERY DAY 90 tablet 1   levocetirizine (XYZAL) 5 MG tablet Take 1 tablet (5 mg total) by mouth every evening. 90 tablet 1   meloxicam (MOBIC) 7.5 MG  tablet Take 1 tablet (7.5 mg total) by mouth daily. 14 tablet 0   Probiotic Product (PROBIOTIC-10 PO) Take by mouth.     Red Yeast Rice Extract (RED YEAST RICE PO) Take by mouth.     No current facility-administered medications on file prior to visit.    Review of Systems:  As per HPI- otherwise negative.    Physical Examination: Vitals:   10/29/22 1458  BP: 116/80  Pulse: 94  Resp: 18  Temp: 97.8 F (36.6 C)  SpO2: 97%   Vitals:   10/29/22 1458  Weight: 215 lb 12.8 oz (97.9 kg)  Height: '5\' 4"'$  (1.626 m)   Body mass index is 37.04 kg/m. Ideal Body Weight: Weight in (lb) to have BMI = 25: 145.3  GEN: no acute distress.  Obese, looks well HEENT: Atraumatic, Normocephalic. Bilateral TM wnl, oropharynx is erythematous but no ulcers or exudate.  PEERL,EOMI.  posterior oropharynx area is very small, likely exacerbating sleep apnea Ears and Nose: No external deformity. CV: RRR, No M/G/R. No JVD. No thrill. No extra heart sounds. PULM: CTA B, no wheezes, crackles, rhonchi. No retractions. No resp. distress. No accessory muscle use. ABD: S, NT, ND. No rebound. No HSM. EXTR: No c/c/e PSYCH: Normally interactive. Conversant.   Results for orders placed or performed in visit on 10/29/22  POCT rapid strep A  Result Value Ref Range   Rapid Strep A Screen Positive (A) Negative  POCT Influenza A/B  Result Value Ref Range   Influenza A, POC Negative Negative   Influenza B, POC Negative Negative    Assessment and Plan: Malaise - Plan: POCT rapid strep A, POCT Influenza A/B  Strep pharyngitis - Plan: cefdinir (OMNICEF) 300 MG capsule  Patient seen today with concern of illness.  He tested negative for COVID at home today.  Rapid strep is positive, but symptoms are suggestive of possible bronchitis or sinusitis.  We will treat with Omnicef to cover all of the above. I mentioned potentially seeing ear nose and throat to discuss palatoplasty.  For the time being he declines.  He will let me know if he is not feeling better in the next couple of days- Sooner if worse.    Provided note for work  Signed Lamar Blinks, MD

## 2022-12-07 ENCOUNTER — Other Ambulatory Visit: Payer: Self-pay | Admitting: Family

## 2022-12-07 ENCOUNTER — Other Ambulatory Visit: Payer: Self-pay | Admitting: Family Medicine

## 2022-12-18 ENCOUNTER — Ambulatory Visit (INDEPENDENT_AMBULATORY_CARE_PROVIDER_SITE_OTHER): Payer: BC Managed Care – PPO | Admitting: Family

## 2022-12-18 ENCOUNTER — Encounter: Payer: Self-pay | Admitting: Family

## 2022-12-18 VITALS — BP 130/81 | HR 93 | Temp 98.6°F | Resp 16 | Wt 215.0 lb

## 2022-12-18 DIAGNOSIS — F419 Anxiety disorder, unspecified: Secondary | ICD-10-CM

## 2022-12-18 DIAGNOSIS — M25532 Pain in left wrist: Secondary | ICD-10-CM

## 2022-12-18 MED ORDER — MELOXICAM 7.5 MG PO TABS: 7.5000 mg | ORAL_TABLET | Freq: Every day | ORAL | 0 refills | Status: DC

## 2022-12-18 MED ORDER — HYDROXYZINE PAMOATE 25 MG PO CAPS: 25.0000 mg | ORAL_CAPSULE | Freq: Three times a day (TID) | ORAL | 0 refills | Status: DC | PRN

## 2022-12-18 NOTE — Assessment & Plan Note (Signed)
Present for >1 month. Recommended wrist brace (provided at today's visit) as well as trial of meloxicam. He is advised to let us know if symptoms worsen or if not improved in 2 weeks. Would plan referral to sports medicine at that time.

## 2022-12-18 NOTE — Progress Notes (Signed)
Subjective:   By signing my name below, I, [name], attest that this documentation has been prepared under the direction and in the presence of Steven Chimera, NP 12/18/2022   Patient ID: Steven Powell, male    DOB: 09-18-1981, 41 y.o.   MRN: [id number]  Chief Complaint  Patient presents with   Wrist Pain    Complains of left wrist pain   Anxiety    Patient complains of some anxiety at times     HPI Patient is in today for an office visit   Left Wrist and Elbow Pain: Patient presents with left wrist and elbow pain. He states that symptoms appeared about a month ago. He notes that he does sleep on his left side and notices that when he flexes his wrist to the left, it mimics similar patterns to when he is on his computer. He also notes that pain worsens when he is doing push ups.     Anxiety: His wife notices that his anxiety symptoms are more prevalent in the last few weeks.  For example, he states that when he's eating a lot, he feels as though he is going to have a heart attack and then becomes anxious.   Health Maintenance Due  Topic Date Due   FOOT EXAM  Never done   OPHTHALMOLOGY EXAM  Never done   Diabetic kidney evaluation - Urine ACR  Never done   COVID-19 Vaccine (3 - Pfizer risk series) 05/16/2020    Past Medical History:  Diagnosis Date   Allergy    seasonal mostly in spring   Asthma    childhood   Diabetes mellitus type 2, controlled ([place]) 10/01/2017   Gout    left knee   Hyperlipidemia    Hyperlipidemia associated with type 2 diabetes mellitus ([place]) 10/06/2017   Preventative health care 10/06/2017   Pseudogout    left knee    Past Surgical History:  Procedure Laterality Date   KNEE SURGERY     KNEE SURGERY  2014   arthroscopy left knee, found pseudogout, and meniscal tea    Family History  Problem Relation Age of Onset   Hypertension Mother    Diabetes Father    Heart disease Father    Gout Brother    Hypertension Brother    Stroke  Maternal Grandmother    Stroke Maternal Grandfather    Asthma Daughter    Diabetes Other    Hyperlipidemia Other    Hypertension Other     Social History   Socioeconomic History   Marital status: Married    Spouse name: Not on file   Number of children: Not on file   Years of education: Not on file   Highest education level: Not on file  Occupational History   Not on file  Tobacco Use   Smoking status: Never   Smokeless tobacco: Never  Substance and Sexual Activity   Alcohol use: Yes    Alcohol/week: 0.0 standard drinks of alcohol   Drug use: No   Sexual activity: Yes    Partners: Female  Other Topics Concern   Not on file  Social History Narrative   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 90 tablet 0   colchicine 0.6 MG tablet TAKE 1 TABLET BY MOUTH EVERY DAY 90 tablet 1   levocetirizine (XYZAL) 5 MG tablet Take 1 tablet (5 mg total) by mouth every evening. 90 tablet 1   Probiotic Product (PROBIOTIC-10 PO) Take by mouth.     Red Yeast Rice Extract (RED YEAST RICE PO) Take by mouth.     meloxicam (MOBIC) 7.5 MG tablet Take 1 tablet (7.5 mg total) by mouth daily. 14 tablet 0   cefdinir (OMNICEF) 300 MG capsule Take 1 capsule (300 mg total) by mouth 2 (two) times daily. 20 capsule 0   No facility-administered medications prior to visit.    No  Known Allergies  Review of Systems  Musculoskeletal:  Positive for joint pain (Left Wrist and Elbow).  Psychiatric/Behavioral:  The patient is nervous/anxious.        Objective:    Physical Exam Constitutional:      General: He is not in acute distress.    Appearance: Normal appearance. He is well-developed. He is not ill-appearing.  HENT:     Head: Normocephalic and atraumatic.     Right Ear: External ear normal.     Left Ear: External ear normal.  Eyes:     Extraocular Movements: Extraocular movements intact.     Pupils: Pupils are equal, round, and reactive to light.  Cardiovascular:     Rate and Rhythm: Normal rate and regular rhythm.     Heart sounds: Normal heart sounds. No murmur heard.    No gallop.  Pulmonary:     Effort: Pulmonary effort is normal. No respiratory distress.     Breath sounds: Normal breath sounds. No wheezing or rales.  Musculoskeletal:     Comments: Mild swelling of left wrist.  No swelling of elbow noted  Skin:    General: Skin is warm and dry.  Neurological:     Mental Status: He is alert and oriented to person, place, and time.  Psychiatric:        Mood and Affect: Mood normal.        Behavior: Behavior normal.        Thought Content: Thought content normal.        Judgment: Judgment normal.     BP 130/81 (BP Location: Right Arm, Patient Position: Sitting, Cuff Size: Large)   Pulse 93   Temp 98.6 F (37 C) (Oral)   Resp 16   Wt 215 lb (97.5 kg)   SpO2 99%   BMI 36.90 kg/m  Wt Readings from Last 3 Encounters:  12/18/22 215 lb (97.5 kg)  10/29/22 215 lb 12.8 oz (97.9 kg)  07/17/22 211 lb 12.8 oz (96.1 kg)       Assessment & Plan:   Problem List Items Addressed This Visit       Unprioritized   Left wrist pain    Present for >1 month. Recommended wrist brace (provided at today's visit) as well as trial of meloxicam. He is advised to let us know if symptoms worsen or if not  improved in 2 weeks. Would plan referral to sports  medicine at that time.        Anxiety - Primary    He is having intermittent anxiety symptoms. I don't think his symptoms are frequent enough to warrant a daily medication, however I will give him an rx for prn hydroxyzine to use.  If frequency of use or if hydroxyzine is not helpful for his anxiety symptoms, then I have asked him to reach back out and we can discuss a daily med such as SSRI. I also gave him a pamphlet with contact information for behavioral medicine to schedule an appointment with a counselor.       Relevant Medications   hydrOXYzine (VISTARIL) 25 MG capsule   Meds ordered this encounter  Medications   hydrOXYzine (VISTARIL) 25 MG capsule    Sig: Take 1 capsule (25 mg total) by mouth every 8 (eight) hours as needed.    Dispense:  30 capsule    Refill:  0    Order Specific Question:   Supervising Provider    Answer:   Penni Homans A [[id number]]   meloxicam (MOBIC) 7.5 MG tablet    Sig: Take 1 tablet (7.5 mg total) by mouth daily.    Dispense:  14 tablet    Refill:  0    Order Specific Question:   Supervising Provider    Answer:   Penni Homans A [[id number]]    I, Nance Pear, NP, personally preformed the services described in this documentation.  All medical record entries made by the scribe were at my direction and in my presence.  I have reviewed the chart and discharge instructions (if applicable) and agree that the record reflects my personal performance and is accurate and complete. 12/18/2022   I,Amber Collins,acting as a scribe for Nance Pear, NP.,have documented all relevant documentation on the behalf of Nance Pear, NP,as directed by  Nance Pear, NP while in the presence of Nance Pear, NP.    Nance Pear, NP

## 2022-12-18 NOTE — Assessment & Plan Note (Signed)
He is having intermittent anxiety symptoms. I don't think his symptoms are frequent enough to warrant a daily medication, however I will give him an rx for prn hydroxyzine to use.  If frequency of use or if hydroxyzine is not helpful for his anxiety symptoms, then I have asked him to reach back out and we can discuss a daily med such as SSRI. I also gave him a pamphlet with contact information for behavioral medicine to schedule an appointment with a counselor.

## 2023-01-11 ENCOUNTER — Other Ambulatory Visit: Payer: Self-pay | Admitting: Family

## 2023-01-11 ENCOUNTER — Encounter: Payer: Self-pay | Admitting: Family

## 2023-01-11 DIAGNOSIS — M25532 Pain in left wrist: Secondary | ICD-10-CM

## 2023-02-18 ENCOUNTER — Other Ambulatory Visit: Payer: Self-pay | Admitting: Family Medicine

## 2023-03-27 ENCOUNTER — Encounter: Payer: Self-pay | Admitting: Family

## 2023-03-27 MED ORDER — LEVOCETIRIZINE DIHYDROCHLORIDE 5 MG PO TABS: 5.0000 mg | ORAL_TABLET | Freq: Every evening | ORAL | 1 refills | Status: AC

## 2023-04-02 ENCOUNTER — Other Ambulatory Visit (HOSPITAL_COMMUNITY): Payer: Self-pay

## 2023-04-03 ENCOUNTER — Encounter: Payer: Self-pay | Admitting: Family

## 2023-04-22 ENCOUNTER — Ambulatory Visit: Payer: 59 | Admitting: Family

## 2023-04-22 VITALS — BP 129/81 | HR 95 | Temp 98.5°F | Resp 16 | Wt 216.0 lb

## 2023-04-22 DIAGNOSIS — R5383 Other fatigue: Secondary | ICD-10-CM

## 2023-04-22 LAB — CBC WITH DIFFERENTIAL/PLATELET
Basophils Absolute: 0.1 10*3/uL (ref 0.0–0.1)
Basophils Relative: 1.4 % (ref 0.0–3.0)
Eosinophils Absolute: 0.4 10*3/uL (ref 0.0–0.7)
Eosinophils Relative: 4.7 % (ref 0.0–5.0)
HCT: 44.8 % (ref 39.0–52.0)
Hemoglobin: 15.2 g/dL (ref 13.0–17.0)
Lymphocytes Relative: 28.3 % (ref 12.0–46.0)
Lymphs Abs: 2.1 10*3/uL (ref 0.7–4.0)
MCHC: 33.8 g/dL (ref 30.0–36.0)
MCV: 91.6 fl (ref 78.0–100.0)
Monocytes Absolute: 0.6 10*3/uL (ref 0.1–1.0)
Monocytes Relative: 8.5 % (ref 3.0–12.0)
Neutro Abs: 4.2 10*3/uL (ref 1.4–7.7)
Neutrophils Relative %: 57.1 % (ref 43.0–77.0)
Platelets: 293 10*3/uL (ref 150.0–400.0)
RBC: 4.9 Mil/uL (ref 4.22–5.81)
RDW: 13.1 % (ref 11.5–15.5)
WBC: 7.4 10*3/uL (ref 4.0–10.5)

## 2023-04-22 LAB — TSH: TSH: 1.93 u[IU]/mL (ref 0.35–5.50)

## 2023-04-22 NOTE — Assessment & Plan Note (Signed)
New. Will check testosterone level.  Will also check TSH, CBC due to complaint of fatigue. Lifestyle change with new baby and feeding schedule may be contributing factors.

## 2023-04-22 NOTE — Progress Notes (Signed)
Subjective:   By signing my name below, I, Steven Powell, attest that this documentation has been prepared under the direction and in the presence of Steven Craze, NP. 04/22/2023   Patient ID: Steven Powell, male    DOB: 08/12/1981, 42 y.o.   MRN: [id number]  Chief Complaint  Patient presents with   Fatigue    Patient reports feeling more tired than ussual    HPI Patient is in today for a office visit.   He complains of experiencing more fatigue than normal. He is using a sleep apnea machine every night. He has a new baby and reports getting more sleep compared to the first 2 months. He is also exercising and weight lifting regularly but finds he is putting in more effort and is getting less results compared to last year.   [facility]- reports great compliance with his cpap.   Past Medical History:  Diagnosis Date   Allergy    seasonal mostly in spring   Asthma    childhood   Diabetes mellitus type 2, controlled 10/01/2017   Gout    left knee   Hyperlipidemia    Hyperlipidemia associated with type 2 diabetes mellitus 10/06/2017   Preventative health care 10/06/2017   Pseudogout    left knee    Past Surgical History:  Procedure Laterality Date   KNEE SURGERY     KNEE SURGERY  2014   arthroscopy left knee, found pseudogout, and meniscal tea    Family History  Problem Relation Age of Onset   Hypertension Mother    Diabetes Father    Heart disease Father    Gout Brother    Hypertension Brother    Stroke Maternal Grandmother    Stroke Maternal Grandfather    Asthma Daughter    Diabetes Other    Hyperlipidemia Other    Hypertension Other     Social History   Socioeconomic History   Marital status: Married    Spouse name: Not on file   Number of children: Not on file   Years of education: Not on file   Highest education level: Bachelor's degree (e.g., BA, AB, BS)  Occupational History   Not on file  Tobacco Use   Smoking status: Never   Smokeless tobacco:  Never  Substance and Sexual Activity   Alcohol use: Yes    Alcohol/week: 0.0 standard drinks of alcohol   Drug use: No   Sexual activity: Yes    Partners: Female  Other Topics Concern   Not on file  Social History Narrative   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   Never Smoked    Alcohol use-yes (social)   No dietary restrictions, intermittent fasting          Social Determinants of Health   Financial Resource Strain: Low Risk  (04/21/2023)   Overall Financial Resource Strain (CARDIA)    Difficulty of Paying Living Expenses: Not hard at all  Food Insecurity: No Food Insecurity (04/21/2023)   Hunger Vital Sign    Worried About Running Out of Food in the Last Year: Never true    Ran Out of Food in the Last Year: Never true  Transportation Needs: No Transportation Needs (04/21/2023)   PRAPARE - Administrator, Civil Service (Medical): No    Lack of Transportation (Non-Medical): No  Physical Activity: Sufficiently Active (04/21/2023)   Exercise Vital Sign    Days of Exercise per Week:  3 days    Minutes of Exercise per Session: 60 min  Stress: No Stress Concern Present (04/21/2023)   [employer] of Occupational Health - Occupational Stress Questionnaire    Feeling of Stress : Only a little  Social Connections: Moderately Integrated (04/21/2023)   Social Connection and Isolation Panel [NHANES]    Frequency of Communication with Friends and Family: More than three times a week    Frequency of Social Gatherings with Friends and Family: Three times a week    Attends Religious Services: More than 4 times per year    Active Member of Clubs or Organizations: No    Attends Engineer, structural: Not on file    Marital Status: Married  Catering manager Violence: Not on file    Outpatient Medications Prior to Visit  Medication Sig Dispense Refill   allopurinol (ZYLOPRIM) 100 MG tablet TAKE 2 TABLETS DAILY 180 tablet 3    amLODipine (NORVASC) 5 MG tablet TAKE 1 TABLET DAILY 90 tablet 3   colchicine 0.6 MG tablet TAKE 1 TABLET BY MOUTH EVERY DAY 90 tablet 1   hydrOXYzine (VISTARIL) 25 MG capsule Take 1 capsule (25 mg total) by mouth every 8 (eight) hours as needed. 30 capsule 0   levocetirizine (XYZAL) 5 MG tablet Take 1 tablet (5 mg total) by mouth every evening. 90 tablet 1   meloxicam (MOBIC) 7.5 MG tablet Take 1 tablet (7.5 mg total) by mouth daily. 14 tablet 0   Probiotic Product (PROBIOTIC-10 PO) Take by mouth.     Red Yeast Rice Extract (RED YEAST RICE PO) Take by mouth.     No facility-administered medications prior to visit.    No Known Allergies  Review of Systems  Constitutional:  Positive for malaise/fatigue.  Musculoskeletal:        (+)building less muscle mass with weight lifting       Objective:    Physical Exam Constitutional:      General: He is not in acute distress.    Appearance: Normal appearance. He is well-developed. He is not ill-appearing.  HENT:     Head: Normocephalic and atraumatic.     Right Ear: External ear normal.     Left Ear: External ear normal.  Eyes:     Extraocular Movements: Extraocular movements intact.     Pupils: Pupils are equal, round, and reactive to light.  Cardiovascular:     Rate and Rhythm: Normal rate and regular rhythm.     Heart sounds: Normal heart sounds. No murmur heard.    No gallop.  Pulmonary:     Effort: Pulmonary effort is normal. No respiratory distress.     Breath sounds: Normal breath sounds. No wheezing or rales.  Skin:    General: Skin is warm and dry.  Neurological:     Mental Status: He is alert and oriented to person, place, and time.  Psychiatric:        Behavior: Behavior normal.        Thought Content: Thought content normal.        Judgment: Judgment normal.     BP 129/81 (BP Location: Right Arm, Patient Position: Sitting, Cuff Size: Large)   Pulse 95   Temp 98.5 F (36.9 C) (Oral)   Resp 16   Wt 216 lb  (98 kg)   SpO2 100%   BMI 37.08 kg/m  Wt Readings from Last 3 Encounters:  04/22/23 216 lb (98 kg)  12/18/22 215 lb (97.5 kg)  10/29/22 215  lb 12.8 oz (97.9 kg)       Assessment & Plan:  Fatigue, unspecified type Assessment & Plan: New. Will check testosterone level.  Will also check TSH, CBC due to complaint of fatigue. Lifestyle change with new baby and feeding schedule may be contributing factors.      I, Lemont Fillers, NP, personally preformed the services described in this documentation.  All medical record entries made by the scribe were at my direction and in my presence.  I have reviewed the chart and discharge instructions (if applicable) and agree that the record reflects my personal performance and is accurate and complete. 04/22/2023   I,Steven Powell,acting as a Neurosurgeon for Lemont Fillers, NP.,have documented all relevant documentation on the behalf of Lemont Fillers, NP,as directed by  Lemont Fillers, NP while in the presence of Lemont Fillers, NP.  Lemont Fillers, NP

## 2023-04-23 LAB — TESTOSTERONE TOTAL,FREE,BIO, MALES
Albumin: 4.6 g/dL (ref 3.6–5.1)
Sex Hormone Binding: 15 nmol/L (ref 10–50)
Testosterone, Bioavailable: 174.5 ng/dL (ref 110.0–575.0)
Testosterone, Free: 83.1 pg/mL (ref 46.0–224.0)
Testosterone: 368 ng/dL (ref 250–827)

## 2023-04-30 ENCOUNTER — Encounter: Payer: Self-pay | Admitting: Family

## 2023-04-30 MED ORDER — COLCHICINE 0.6 MG PO TABS: 0.6000 mg | ORAL_TABLET | Freq: Every day | ORAL | 1 refills | Status: AC

## 2023-07-18 ENCOUNTER — Encounter: Payer: Self-pay | Admitting: Family Medicine

## 2023-07-18 ENCOUNTER — Ambulatory Visit: Payer: 59 | Admitting: Family Medicine

## 2023-07-18 VITALS — BP 127/75 | HR 90 | Ht 64.0 in | Wt 218.0 lb

## 2023-07-18 DIAGNOSIS — Z0000 Encounter for general adult medical examination without abnormal findings: Secondary | ICD-10-CM

## 2023-07-18 DIAGNOSIS — I10 Essential (primary) hypertension: Secondary | ICD-10-CM

## 2023-07-18 DIAGNOSIS — R7989 Other specified abnormal findings of blood chemistry: Secondary | ICD-10-CM

## 2023-07-18 DIAGNOSIS — R1013 Epigastric pain: Secondary | ICD-10-CM | POA: Diagnosis not present

## 2023-07-18 DIAGNOSIS — R7303 Prediabetes: Secondary | ICD-10-CM

## 2023-07-18 DIAGNOSIS — R17 Unspecified jaundice: Secondary | ICD-10-CM

## 2023-07-18 DIAGNOSIS — E785 Hyperlipidemia, unspecified: Secondary | ICD-10-CM

## 2023-07-18 LAB — COMPREHENSIVE METABOLIC PANEL
ALT: 139 U/L — ABNORMAL HIGH (ref 0–53)
AST: 66 U/L — ABNORMAL HIGH (ref 0–37)
Albumin: 4.7 g/dL (ref 3.5–5.2)
Alkaline Phosphatase: 84 U/L (ref 39–117)
BUN: 16 mg/dL (ref 6–23)
CO2: 28 mEq/L (ref 19–32)
Calcium: 10.2 mg/dL (ref 8.4–10.5)
Chloride: 102 mEq/L (ref 96–112)
Creatinine, Ser: 1.35 mg/dL (ref 0.40–1.50)
GFR: 65.79 mL/min (ref >60.00)
Glucose, Bld: 135 mg/dL — ABNORMAL HIGH (ref 70–99)
Potassium: 4.5 mEq/L (ref 3.5–5.1)
Sodium: 138 mEq/L (ref 135–145)
Total Bilirubin: 2.1 mg/dL — ABNORMAL HIGH (ref 0.2–1.2)
Total Protein: 7.5 g/dL (ref 6.0–8.3)

## 2023-07-18 LAB — HEMOGLOBIN A1C: Hgb A1c MFr Bld: 7.2 % — ABNORMAL HIGH (ref 4.6–6.5)

## 2023-07-18 LAB — LIPID PANEL
Cholesterol: 298 mg/dL — ABNORMAL HIGH (ref 0–200)
HDL: 50 mg/dL (ref >39.00)
LDL Cholesterol: 213 mg/dL — ABNORMAL HIGH (ref 0–99)
NonHDL: 247.73
Total CHOL/HDL Ratio: 6
Triglycerides: 173 mg/dL — ABNORMAL HIGH (ref 0.0–149.0)
VLDL: 34.6 mg/dL (ref 0.0–40.0)

## 2023-07-18 MED ORDER — MELOXICAM 7.5 MG PO TABS: 7.5000 mg | ORAL_TABLET | Freq: Every day | ORAL | 0 refills | Status: DC

## 2023-07-18 NOTE — Progress Notes (Signed)
Complete physical exam  Patient: Steven Powell   DOB: [birth year]   42 y.o. Male  MRN: [id number]  Subjective:    Chief Complaint  Patient presents with   Annual Exam    Steven Powell is a 42 y.o. male who presents today for a complete physical exam. He reports consuming a general diet. Gym/ health club routine includes 2-3 days per week, mostly weight lifting. He generally feels well. He reports sleeping well. He does have additional problems to discuss today.   Currently lives with: wife and baby  Acute concerns or interim problems since last visit:  -He has had a few days of sternum/epigastric discomfort, mostly related to meals; not noticeable with exercise (works out at [employer] a few days per week). He can sometimes reproduce a muscular ache with palpation - states the past week has been upper body workout program for him. He has not had any associated chest pressure, dyspnea, diaphoresis.   Vision concerns: no concerns Dental concerns: no concerns STD concerns: no concerns  Patient denies ETOH use. Patient denies nicotine use. Patient denies illegal substance use.        Most recent fall risk assessment:    07/18/2023   11:03 AM  Fall Risk   Falls in the past year? 0  Number falls in past yr: 0  Injury with Fall? 0  Risk for fall due to : No Fall Risks  Follow up Falls evaluation completed     Most recent depression screenings:    07/18/2023   11:03 AM 07/17/2022    2:00 PM  PHQ 2/9 Scores  PHQ - 2 Score 0 0            Patient Care Team: Bradd Canary, MD as PCP - General (Family Medicine) Oretha Milch, MD as Consulting Physician (Pulmonary Disease)   Outpatient Medications Prior to Visit  Medication Sig   allopurinol (ZYLOPRIM) 100 MG tablet TAKE 2 TABLETS DAILY   amLODipine (NORVASC) 5 MG tablet TAKE 1 TABLET DAILY   colchicine 0.6 MG tablet Take 1 tablet (0.6 mg total) by mouth daily.   hydrOXYzine (VISTARIL) 25 MG capsule Take 1  capsule (25 mg total) by mouth every 8 (eight) hours as needed.   levocetirizine (XYZAL) 5 MG tablet Take 1 tablet (5 mg total) by mouth every evening.   Probiotic Product (PROBIOTIC-10 PO) Take by mouth.   Red Yeast Rice Extract (RED YEAST RICE PO) Take by mouth.   [DISCONTINUED] meloxicam (MOBIC) 7.5 MG tablet Take 1 tablet (7.5 mg total) by mouth daily.   No facility-administered medications prior to visit.    ROS All review of systems negative except what is listed in the HPI        Objective:     BP 127/75   Pulse 90   Ht 5\' 4"  (1.626 m)   Wt 218 lb (98.9 kg)   SpO2 99%   BMI 37.42 kg/m    Physical Exam Vitals reviewed.  Constitutional:      General: He is not in acute distress.    Appearance: Normal appearance. He is obese. He is not ill-appearing.  HENT:     Head: Normocephalic and atraumatic.     Right Ear: Tympanic membrane normal.     Left Ear: Tympanic membrane normal.     Nose: Nose normal.     Mouth/Throat:     Mouth: Mucous membranes are moist.     Pharynx: Oropharynx is clear.  Eyes:  Extraocular Movements: Extraocular movements intact.     Conjunctiva/sclera: Conjunctivae normal.     Pupils: Pupils are equal, round, and reactive to light.  Neck:     Vascular: No carotid bruit.  Cardiovascular:     Rate and Rhythm: Normal rate and regular rhythm.     Pulses: Normal pulses.     Heart sounds: Normal heart sounds.  Pulmonary:     Effort: Pulmonary effort is normal.     Breath sounds: Normal breath sounds.  Abdominal:     General: Abdomen is flat. Bowel sounds are normal. There is no distension.     Palpations: Abdomen is soft. There is no mass.     Tenderness: There is no abdominal tenderness. There is no right CVA tenderness, left CVA tenderness, guarding or rebound.  Genitourinary:    Comments: Deferred exam Musculoskeletal:        General: Normal range of motion.     Cervical back: Normal range of motion and neck supple. No tenderness.      Right lower leg: No edema.     Left lower leg: No edema.  Lymphadenopathy:     Cervical: No cervical adenopathy.  Skin:    General: Skin is warm and dry.     Capillary Refill: Capillary refill takes less than 2 seconds.  Neurological:     General: No focal deficit present.     Mental Status: He is alert and oriented to person, place, and time. Mental status is at baseline.  Psychiatric:        Mood and Affect: Mood normal.        Behavior: Behavior normal.        Thought Content: Thought content normal.        Judgment: Judgment normal.        No results found for any visits on 07/18/23.     Assessment & Plan:    Routine Health Maintenance and Physical Exam Discussed health promotion and safety including diet and exercise recommendations, dental health, and injury prevention. Tobacco cessation if applicable. Seat belts, sunscreen, smoke detectors, etc.    Immunization History  Administered Date(s) Administered   PFIZER(Purple Top)SARS-COV-2 Vaccination 03/24/2020, 04/18/2020   Tdap 04/07/2020    Health Maintenance  Topic Date Due   COVID-19 Vaccine (3 - Pfizer risk series) 05/16/2020   DTaP/Tdap/Td (2 - Td or Tdap) 04/07/2030   Hepatitis C Screening  Completed   HIV Screening  Completed   HPV VACCINES  Aged Out   INFLUENZA VACCINE  Discontinued        Problem List Items Addressed This Visit     Prediabetes   Relevant Orders   Comprehensive metabolic panel   Hemoglobin A1c   Lipid panel   Dyslipidemia - Primary   Relevant Orders   Lipid panel   Hypertension   Other Visit Diagnoses     Annual physical exam       Relevant Orders   Comprehensive metabolic panel   Hemoglobin A1c   Lipid panel   Epigastric discomfort     Discomfort is worse with meals, not physical activity. Consider GERD. Recommend adding Pepcid or Prilosec and following GERD-precautions. Patient aware of signs/symptoms requiring further/urgent evaluation.       Return in about  1 year (around 07/17/2024) for physical.     Clayborne Dana, NP

## 2023-07-18 NOTE — Addendum Note (Signed)
Addended by: Hyman Hopes B on: 07/18/2023 05:16 PM   Modules accepted: Orders

## 2023-07-19 ENCOUNTER — Encounter: Payer: 59 | Admitting: Family

## 2023-07-22 ENCOUNTER — Telehealth: Payer: 59 | Admitting: Family

## 2023-07-22 ENCOUNTER — Encounter: Payer: Self-pay | Admitting: Family Medicine

## 2023-07-22 ENCOUNTER — Telehealth: Payer: 59 | Admitting: Family Medicine

## 2023-07-22 VITALS — Ht 64.0 in | Wt 218.0 lb

## 2023-07-22 DIAGNOSIS — E119 Type 2 diabetes mellitus without complications: Secondary | ICD-10-CM | POA: Diagnosis not present

## 2023-07-22 DIAGNOSIS — E785 Hyperlipidemia, unspecified: Secondary | ICD-10-CM

## 2023-07-22 DIAGNOSIS — Z7984 Long term (current) use of oral hypoglycemic drugs: Secondary | ICD-10-CM

## 2023-07-22 MED ORDER — BLOOD GLUCOSE TEST VI STRP
1.0000 | ORAL_STRIP | Freq: Three times a day (TID) | 0 refills | Status: AC
Start: 2023-07-22 — End: 2023-08-21

## 2023-07-22 MED ORDER — LANCETS MISC. MISC: 1.0000 | Freq: Three times a day (TID) | 0 refills | Status: AC

## 2023-07-22 MED ORDER — MELOXICAM 7.5 MG PO TABS: 7.5000 mg | ORAL_TABLET | Freq: Every day | ORAL | 0 refills | Status: AC

## 2023-07-22 MED ORDER — BLOOD GLUCOSE MONITORING SUPPL DEVI: 1.0000 | Freq: Three times a day (TID) | 0 refills | Status: DC

## 2023-07-22 MED ORDER — OZEMPIC (0.25 OR 0.5 MG/DOSE) 2 MG/3ML ~~LOC~~ SOPN: 0.2500 mg | PEN_INJECTOR | SUBCUTANEOUS | 2 refills | Status: DC

## 2023-07-22 MED ORDER — LANCET DEVICE MISC
1.0000 | Freq: Three times a day (TID) | 0 refills | Status: AC
Start: 2023-07-22 — End: 2023-08-21

## 2023-07-22 NOTE — Progress Notes (Signed)
Virtual Video Visit via MyChart Note  I connected with  Steven Powell on 07/22/23 at  8:20 AM EDT by the video enabled telemedicine application for MyChart, and verified that I am speaking with the correct person using two identifiers.   I introduced myself as a Publishing rights manager with the practice. We discussed the limitations of evaluation and management by telemedicine and the availability of in person appointments. The patient expressed understanding and agreed to proceed.  Participating parties in this visit include: The patient and the nurse practitioner listed.  The patient is: At home I am: In the office - [facility] at [facility]  Subjective:    CC:  Chief Complaint  Patient presents with   Medical Management of Chronic Issues   Diabetes    HPI: Steven Powell is a 42 y.o. year old male presenting today via MyChart today for lab results follow-up.  Patient's recent CPE with abnormal labs. A1c now in diabetic range (7.2%) and cholesterol elevated. Discussed options with patient: lifestyle measures, metformin, other oral and injectable diabetes medications and cholesterol management. He has been struggling to lose weight recently and is interested in GLP-1. He has already started making appropriate lifestyle changes including diet and exercise. Discussed that we like to use statins in patient's with diabetes for cardiovascular prevention, but he prefers to try 3 months of lifestyle changes before starting another medication.         Past medical history, Surgical history, Family history not pertinant except as noted below, Social history, Allergies, and medications have been entered into the medical record, reviewed, and corrections made.   Review of Systems:  All review of systems negative except what is listed in the HPI   Objective:    General:  Speaking clearly in complete sentences. Absent shortness of breath noted.   Alert and oriented x3.    Normal judgment.  Absent acute distress.   Impression and Recommendations:    Problem List Items Addressed This Visit     Hyperlipidemia    Hyperlipidemia: Medication management: declined at this time - discussed benefit of being on statin with diabetes, but prefers not to start multiple new meds at the same time - reassess at 70-month follow-up appointment with PCP Lifestyle factors for lowering cholesterol include: Diet therapy - heart-healthy diet rich in fruits, veggies, fiber-rich whole grains, lean meats, chicken, fish (at least twice a week), fat-free or 1% dairy products; foods low in saturated/trans fats, cholesterol, sodium, and sugar. Mediterranean diet has shown to be very heart healthy. Regular exercise - recommend at least 30 minutes a day, 5 times per week Weight management       Type 2 diabetes mellitus without complication, without long-term current use of insulin (HCC) - Primary    New diabetes - disease education provided and treatment options discussed - Adding Ozempic (will adjust as needed pending insurance) - Discussed diet and exercise. Sending him educational booklet.  - Sending in glucometer - discussed keeping log of fasting glucose levels      Relevant Medications   Blood Glucose Monitoring Suppl DEVI   Glucose Blood (BLOOD GLUCOSE TEST STRIPS) STRP   Lancet Device MISC   Lancets Misc. MISC   Semaglutide,0.25 or 0.5MG /DOS, (OZEMPIC, 0.25 OR 0.5 MG/DOSE,) 2 MG/3ML SOPN        Follow-up if symptoms worsen or fail to improve.    I discussed the assessment and treatment plan with the patient. The patient was provided an opportunity to ask  questions and all were answered. The patient agreed with the plan and demonstrated an understanding of the instructions.   The patient was advised to call back or seek an in-person evaluation if the symptoms worsen or if the condition fails to improve as anticipated.    Steven Dana, NP

## 2023-07-22 NOTE — Assessment & Plan Note (Signed)
Hyperlipidemia: Medication management: declined at this time - discussed benefit of being on statin with diabetes, but prefers not to start multiple new meds at the same time - reassess at 71-month follow-up appointment with PCP Lifestyle factors for lowering cholesterol include: Diet therapy - heart-healthy diet rich in fruits, veggies, fiber-rich whole grains, lean meats, chicken, fish (at least twice a week), fat-free or 1% dairy products; foods low in saturated/trans fats, cholesterol, sodium, and sugar. Mediterranean diet has shown to be very heart healthy. Regular exercise - recommend at least 30 minutes a day, 5 times per week Weight management

## 2023-07-22 NOTE — Assessment & Plan Note (Signed)
New diabetes - disease education provided and treatment options discussed - Adding Ozempic (will adjust as needed pending insurance) - Discussed diet and exercise. Sending him educational booklet.  - Sending in glucometer - discussed keeping log of fasting glucose levels

## 2023-07-22 NOTE — Patient Instructions (Addendum)
Diabetes: - Adding Ozempic (will adjust as needed pending insurance) - Discussed diet and exercise. Sending him educational booklet.  - Sending in glucometer - discussed keeping log of fasting glucose levels   Hyperlipidemia: Medication management: declined at this time - discussed benefit of being on statin with diabetes, but prefers not to start multiple new meds at the same time - reassess at 61-month follow-up appointment with PCP Lifestyle factors for lowering cholesterol include: Diet therapy - heart-healthy diet rich in fruits, veggies, fiber-rich whole grains, lean meats, chicken, fish (at least twice a week), fat-free or 1% dairy products; foods low in saturated/trans fats, cholesterol, sodium, and sugar. Mediterranean diet has shown to be very heart healthy. Regular exercise - recommend at least 30 minutes a day, 5 times per week Weight management

## 2023-07-23 ENCOUNTER — Telehealth: Payer: Self-pay

## 2023-07-23 NOTE — Telephone Encounter (Signed)
PA initiated via Covermymeds; KEY: [id number].  PA has already submitted and is in process for this patient and drug.;[id number];UXLKGM:In Process;

## 2023-07-25 ENCOUNTER — Other Ambulatory Visit (HOSPITAL_COMMUNITY): Payer: Self-pay

## 2023-07-25 NOTE — Telephone Encounter (Signed)
Pharmacy Patient Advocate Encounter  Received notification from [facility] that Prior Authorization for Ozempic has been APPROVED from 06/23/2023 to 07/22/2024. Ran test claim, Copay is $Unable to see, due to refill too soon.  PA #/Case ID/Reference #: [id number]

## 2023-07-29 ENCOUNTER — Encounter: Payer: Self-pay | Admitting: Family Medicine

## 2023-08-05 ENCOUNTER — Telehealth (INDEPENDENT_AMBULATORY_CARE_PROVIDER_SITE_OTHER): Payer: 59 | Admitting: Family

## 2023-08-05 DIAGNOSIS — U071 COVID-19: Secondary | ICD-10-CM | POA: Diagnosis not present

## 2023-08-05 NOTE — Progress Notes (Signed)
Subjective:     Patient ID: Steven Powell, male    DOB: 1981/09/25, 42 y.o.   MRN: [id number]  Chief Complaint  Patient presents with   Covid Positive    Tested positive for covid yesterday   Covid symptoms    Patient reports sore throat, cough, body aches and congestion that started yesterday.     HPI  Discussed the use of AI scribe software for clinical note transcription with the patient, who gave verbal consent to proceed.  Patient presents today for a video visit.  Patient location: Home. Patient and provider in visit Provider location: Office  I discussed the limitations of evaluation and management by telemedicine and the availability of in person appointments. The patient expressed understanding and agreed to proceed.  Visit Date: 08/05/2023  Today's healthcare provider: Lemont Fillers, NP   42 year old patient presents for video visit following a positive covid test last night.  Symptoms began last night and include  positive covid testing.  Patient states that he is having congestion, body aches, fever, fatigue, and dry throat. Pt's wife and infant son recently had Covid as well.     Health Maintenance Due  Topic Date Due   FOOT EXAM  Never done   OPHTHALMOLOGY EXAM  Never done   Diabetic kidney evaluation - Urine ACR  Never done    Past Medical History:  Diagnosis Date   Allergy    seasonal mostly in spring   Asthma    childhood   Diabetes mellitus type 2, controlled (HCC) 10/01/2017   Gout    left knee   Hyperlipidemia    Hyperlipidemia associated with type 2 diabetes mellitus (HCC) 10/06/2017   Preventative health care 10/06/2017   Pseudogout    left knee    Past Surgical History:  Procedure Laterality Date   KNEE SURGERY     KNEE SURGERY  2014   arthroscopy left knee, found pseudogout, and meniscal tea    Family History  Problem Relation Age of Onset   Hypertension Mother    Diabetes Father    Heart disease Father    Gout Brother     Hypertension Brother    Stroke Maternal Grandmother    Stroke Maternal Grandfather    Asthma Daughter    Diabetes Other    Hyperlipidemia Other    Hypertension Other     Social History   Socioeconomic History   Marital status: Married    Spouse name: Not on file   Number of children: Not on file   Years of education: Not on file   Highest education level: Bachelor's degree (e.g., BA, AB, BS)  Occupational History   Not on file  Tobacco Use   Smoking status: Never   Smokeless tobacco: Never  Substance and Sexual Activity   Alcohol use: Yes    Alcohol/week: 0.0 standard drinks of alcohol   Drug use: No   Sexual activity: Yes    Partners: Female  Other Topics Concern   Not on file  Social History Narrative   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   Never Smoked    Alcohol use-yes (social)   No dietary restrictions, intermittent fasting          Social Determinants of Health   Financial Resource Strain: Low Risk  (04/21/2023)   Overall Financial Resource Strain (CARDIA)    Difficulty of Paying Living Expenses: Not hard at all  Food Insecurity: No Food Insecurity (04/21/2023)   Hunger Vital Sign    Worried About Running Out of Food in the Last Year: Never true    Ran Out of Food in the Last Year: Never true  Transportation Needs: No Transportation Needs (04/21/2023)   PRAPARE - Administrator, Civil Service (Medical): No    Lack of Transportation (Non-Medical): No  Physical Activity: Sufficiently Active (04/21/2023)   Exercise Vital Sign    Days of Exercise per Week: 3 days    Minutes of Exercise per Session: 60 min  Stress: No Stress Concern Present (04/21/2023)   [employer] of Occupational Health - Occupational Stress Questionnaire    Feeling of Stress : Only a little  Social Connections: Moderately Integrated (04/21/2023)   Social Connection and Isolation Panel [NHANES]    Frequency of Communication  with Friends and Family: More than three times a week    Frequency of Social Gatherings with Friends and Family: Three times a week    Attends Religious Services: More than 4 times per year    Active Member of Clubs or Organizations: No    Attends Engineer, structural: Not on file    Marital Status: Married  Catering manager Violence: Not on file    Outpatient Medications Prior to Visit  Medication Sig Dispense Refill   allopurinol (ZYLOPRIM) 100 MG tablet TAKE 2 TABLETS DAILY 180 tablet 3   amLODipine (NORVASC) 5 MG tablet TAKE 1 TABLET DAILY 90 tablet 3   Blood Glucose Monitoring Suppl DEVI 1 each by Does not apply route in the morning, at noon, and at bedtime. May substitute to any manufacturer covered by patient's insurance. 1 each 0   colchicine 0.6 MG tablet Take 1 tablet (0.6 mg total) by mouth daily. 90 tablet 1   Glucose Blood (BLOOD GLUCOSE TEST STRIPS) STRP 1 each by In Vitro route in the morning, at noon, and at bedtime. May substitute to any manufacturer covered by patient's insurance. 100 strip 0   hydrOXYzine (VISTARIL) 25 MG capsule Take 1 capsule (25 mg total) by mouth every 8 (eight) hours as needed. 30 capsule 0   Lancet Device MISC 1 each by Does not apply route in the morning, at noon, and at bedtime. May substitute to any manufacturer covered by patient's insurance. 1 each 0   Lancets Misc. MISC 1 each by Does not apply route in the morning, at noon, and at bedtime. May substitute to any manufacturer covered by patient's insurance. 100 each 0   levocetirizine (XYZAL) 5 MG tablet Take 1 tablet (5 mg total) by mouth every evening. 90 tablet 1   meloxicam (MOBIC) 7.5 MG tablet Take 1 tablet (7.5 mg total) by mouth daily. 90 tablet 0   Probiotic Product (PROBIOTIC-10 PO) Take by mouth.     Red Yeast Rice Extract (RED YEAST RICE PO) Take by mouth.     Semaglutide,0.25 or 0.5MG /DOS, (OZEMPIC, 0.25 OR 0.5 MG/DOSE,) 2 MG/3ML SOPN Inject 0.25 mg into the skin once a week.  After 4 weeks, increase to 0.5 mg weekly. 3 mL 2   No facility-administered medications prior to visit.    No Known Allergies  ROS    See HPI Objective:    Physical Exam Constitutional:      Appearance: Normal appearance.  HENT:     Head: Normocephalic.  Eyes:     General: Lids are normal.  Pulmonary:     Effort: Pulmonary effort is normal.  Neurological:  Mental Status: He is alert and oriented to person, place, and time.  Psychiatric:        Mood and Affect: Mood normal.        Behavior: Behavior normal. Behavior is cooperative.        Thought Content: Thought content normal.        Judgment: Judgment normal.      There were no vitals taken for this visit. Wt Readings from Last 3 Encounters:  07/22/23 218 lb (98.9 kg)  07/18/23 218 lb (98.9 kg)  04/22/23 216 lb (98 kg)       Assessment & Plan:   COVID-19 infection- discussed limited efficacy of paxlovid.  Offered treatment with paxlovid, but he opted to focus on supportive care.  Recommended that he quarantine for 5 day. Today counts as day 1 for covid infection.  Explained to patient to call office back if his symptoms get worse.   Patient is needing work note to quarantine at home for the next five days. Recommend rest, hydration.  Problem List Items Addressed This Visit       Unprioritized   COVID-19 virus infection - Primary     COVID-19 infection- discussed limited efficacy of paxlovid.  Offered treatment with paxlovid, but he opted to focus on supportive care.  Recommended that he quarantine for 5 day. Today counts as day 1 for covid infection.  Explained to patient to call office back if his symptoms get worse or if symptoms fail to improve.   Patient is needing work note to quarantine at home for the next five days. Recommend rest, hydration, tylenol and motrin as needed for pain/fever.       I discussed the assessment and treatment plan with the patient. The patient was provided an  opportunity to ask questions and all were answered. The patient agreed with the plan and demonstrated an understanding of the instructions.   The patient was advised to call back or seek an in-person evaluation if the symptoms worsen or if the condition fails to improve as anticipated. I am having Steven Powell maintain his Red Yeast Rice Extract (RED YEAST RICE PO), Probiotic Product (PROBIOTIC-10 PO), allopurinol, hydrOXYzine, amLODipine, levocetirizine, colchicine, meloxicam, Blood Glucose Monitoring Suppl, BLOOD GLUCOSE TEST STRIPS, Lancet Device, Lancets Misc., and Ozempic (0.25 or 0.5 MG/DOSE).  No orders of the defined types were placed in this encounter.

## 2023-08-05 NOTE — Assessment & Plan Note (Addendum)
  COVID-19 infection- discussed limited efficacy of paxlovid.  Offered treatment with paxlovid, but he opted to focus on supportive care.  Recommended that he quarantine for 5 day. Today counts as day 1 for covid infection.  Explained to patient to call office back if his symptoms get worse or if symptoms fail to improve.   Patient is needing work note to quarantine at home for the next five days. Recommend rest, hydration, tylenol and motrin as needed for pain/fever.

## 2023-08-17 ENCOUNTER — Encounter: Payer: Self-pay | Admitting: Family Medicine

## 2023-08-19 NOTE — Telephone Encounter (Signed)
Did we order this? I don't see it in his chart.

## 2023-08-27 ENCOUNTER — Encounter: Payer: Self-pay | Admitting: Family

## 2023-08-27 ENCOUNTER — Other Ambulatory Visit (HOSPITAL_BASED_OUTPATIENT_CLINIC_OR_DEPARTMENT_OTHER): Payer: Self-pay

## 2023-08-27 MED ORDER — PREDNISONE 10 MG PO TABS
ORAL_TABLET | ORAL | 0 refills | Status: DC
  Filled 2023-08-27: qty 20, 8d supply, fill #0

## 2023-10-01 ENCOUNTER — Encounter: Payer: Self-pay | Admitting: Family Medicine

## 2023-10-14 ENCOUNTER — Encounter: Payer: Self-pay | Admitting: Family Medicine

## 2023-10-14 ENCOUNTER — Ambulatory Visit: Payer: 59 | Admitting: Family Medicine

## 2023-10-14 VITALS — BP 133/87 | HR 98 | Temp 98.2°F | Resp 18 | Ht 64.0 in | Wt 207.0 lb

## 2023-10-14 DIAGNOSIS — I10 Essential (primary) hypertension: Secondary | ICD-10-CM

## 2023-10-14 DIAGNOSIS — E785 Hyperlipidemia, unspecified: Secondary | ICD-10-CM

## 2023-10-14 DIAGNOSIS — Z7985 Long-term (current) use of injectable non-insulin antidiabetic drugs: Secondary | ICD-10-CM

## 2023-10-14 DIAGNOSIS — E119 Type 2 diabetes mellitus without complications: Secondary | ICD-10-CM | POA: Diagnosis not present

## 2023-10-14 MED ORDER — SEMAGLUTIDE (1 MG/DOSE) 4 MG/3ML ~~LOC~~ SOPN: 1.0000 mg | PEN_INJECTOR | SUBCUTANEOUS | 0 refills | Status: DC

## 2023-10-14 MED ORDER — SEMAGLUTIDE (1 MG/DOSE) 4 MG/3ML ~~LOC~~ SOPN
1.0000 mg | PEN_INJECTOR | SUBCUTANEOUS | 2 refills | Status: DC
Start: 2023-10-14 — End: 2023-10-14

## 2023-10-14 NOTE — Assessment & Plan Note (Signed)
Hyperlipidemia: Medication management: none. Previously discussed benefit of being on statin with diabetes, but prefers not to start multiple new meds at the same time - reassess labs today Lifestyle factors for lowering cholesterol include: Diet therapy - heart-healthy diet rich in fruits, veggies, fiber-rich whole grains, lean meats, chicken, fish (at least twice a week), fat-free or 1% dairy products; foods low in saturated/trans fats, cholesterol, sodium, and sugar. Mediterranean diet has shown to be very heart healthy. Regular exercise - recommend at least 30 minutes a day, 5 times per week Weight management

## 2023-10-14 NOTE — Assessment & Plan Note (Signed)
Lab Results  Component Value Date   HGBA1C 7.2 (H) 07/18/2023    Increase Ozempic to 1 mg/week Discussed diet and exercise F/u in 3 months

## 2023-10-14 NOTE — Progress Notes (Signed)
Established Patient Office Visit  Subjective   Patient ID: Steven Powell, male    DOB: [birth year]  Age: 42 y.o. MRN: [id number]  Chief Complaint  Patient presents with   Follow-up    Hemoglobin and A1C    HPI   Discussed the use of AI scribe software for clinical note transcription with the patient, who gave verbal consent to proceed.  History of Present Illness   The patient, with a history of diabetes, reports a perceived plateau in their weight. They note a return of hunger levels to their pre-treatment state. However, they also report good blood sugar control, with fasting levels consistently under 100 in the past week. They have been using a glucometer to track their blood sugar levels. They have been on Ozempic for approximately three months, starting with a dose of 0.25 and increasing to 0.5 after four weeks. They deny any side effects from the medication.  In terms of lifestyle, the patient has been engaging in cardio and circuit training. They express dissatisfaction with their weight loss progress, noting a current weight of 206 lbs, down from a high of 218 lbs. They report a lowest weight of 203 lbs, but feel they have plateaued and are unable to break this barrier.  The patient is also on amlodipine 5mg  for blood pressure control, which they report as stable, with a recent reading of 110/69 at home. They deny any leg swelling.     Lab Results  Component Value Date   HGBA1C 7.2 (H) 07/18/2023    Wt Readings from Last 3 Encounters:  10/14/23 207 lb (93.9 kg)  07/22/23 218 lb (98.9 kg)  07/18/23 218 lb (98.9 kg)   BP Readings from Last 3 Encounters:  10/14/23 133/87  07/18/23 127/75  04/22/23 129/81        ROS All review of systems negative except what is listed in the HPI     Objective:     BP 133/87 (BP Location: Right Arm, Patient Position: Sitting, Cuff Size: Large)   Pulse 98   Temp 98.2 F (36.8 C) (Oral)   Resp 18   Ht 5\' 4"  (1.626 m)   Wt  207 lb (93.9 kg)   SpO2 97%   BMI 35.53 kg/m    Physical Exam Vitals reviewed.  Constitutional:      Appearance: Normal appearance. He is obese.  Cardiovascular:     Rate and Rhythm: Normal rate and regular rhythm.     Heart sounds: Normal heart sounds.  Pulmonary:     Effort: Pulmonary effort is normal.     Breath sounds: Normal breath sounds.  Skin:    General: Skin is warm and dry.  Neurological:     Mental Status: He is alert and oriented to person, place, and time.  Psychiatric:        Mood and Affect: Mood normal.        Behavior: Behavior normal.        Thought Content: Thought content normal.        Judgment: Judgment normal.      No results found for any visits on 10/14/23.    The 10-year ASCVD risk score (Arnett DK, et al., 2019) is: 5.9%    Assessment & Plan:   Problem List Items Addressed This Visit       Active Problems   Hyperlipidemia - Primary (Chronic)    Hyperlipidemia: Medication management: none. Previously discussed benefit of being on statin with diabetes, but prefers not  to start multiple new meds at the same time - reassess labs today Lifestyle factors for lowering cholesterol include: Diet therapy - heart-healthy diet rich in fruits, veggies, fiber-rich whole grains, lean meats, chicken, fish (at least twice a week), fat-free or 1% dairy products; foods low in saturated/trans fats, cholesterol, sodium, and sugar. Mediterranean diet has shown to be very heart healthy. Regular exercise - recommend at least 30 minutes a day, 5 times per week Weight management       Relevant Orders   Lipid panel   Type 2 diabetes mellitus without complication, without long-term current use of insulin (HCC) (Chronic)    Lab Results  Component Value Date   HGBA1C 7.2 (H) 07/18/2023    Increase Ozempic to 1 mg/week Discussed diet and exercise F/u in 3 months       Relevant Orders   Hemoglobin A1c   Lipid panel   Hypertension    Blood pressure is  at goal for age and co-morbidities.   Recommendations: continue amlodipine 5 mg daily  - BP goal <130/80 - monitor and log blood pressures at home - check around the same time each day in a relaxed setting - Limit salt to <2000 mg/day - Follow DASH eating plan (heart healthy diet) - limit alcohol to 2 standard drinks per day for men and 1 per day for women - avoid tobacco products - get at least 2 hours of regular aerobic exercise weekly Patient aware of signs/symptoms requiring further/urgent evaluation. Labs updated today.       Other Visit Diagnoses     Diabetes mellitus treated with injections of non-insulin medication (HCC)           Return in about 3 months (around 01/14/2024) for routine follow-up.    Clayborne Dana, NP

## 2023-10-14 NOTE — Assessment & Plan Note (Signed)
Blood pressure is at goal for age and co-morbidities.   Recommendations: continue amlodipine 5 mg daily  - BP goal <130/80 - monitor and log blood pressures at home - check around the same time each day in a relaxed setting - Limit salt to <2000 mg/day - Follow DASH eating plan (heart healthy diet) - limit alcohol to 2 standard drinks per day for men and 1 per day for women - avoid tobacco products - get at least 2 hours of regular aerobic exercise weekly Patient aware of signs/symptoms requiring further/urgent evaluation. Labs updated today.

## 2023-10-23 ENCOUNTER — Other Ambulatory Visit (INDEPENDENT_AMBULATORY_CARE_PROVIDER_SITE_OTHER): Payer: 59

## 2023-10-23 DIAGNOSIS — R7989 Other specified abnormal findings of blood chemistry: Secondary | ICD-10-CM | POA: Diagnosis not present

## 2023-10-23 DIAGNOSIS — E119 Type 2 diabetes mellitus without complications: Secondary | ICD-10-CM | POA: Diagnosis not present

## 2023-10-23 DIAGNOSIS — R17 Unspecified jaundice: Secondary | ICD-10-CM

## 2023-10-23 DIAGNOSIS — E785 Hyperlipidemia, unspecified: Secondary | ICD-10-CM | POA: Diagnosis not present

## 2023-10-23 LAB — COMPREHENSIVE METABOLIC PANEL
ALT: 45 U/L (ref 0–53)
AST: 24 U/L (ref 0–37)
Albumin: 4.7 g/dL (ref 3.5–5.2)
Alkaline Phosphatase: 60 U/L (ref 39–117)
BUN: 12 mg/dL (ref 6–23)
CO2: 31 meq/L (ref 19–32)
Calcium: 9.8 mg/dL (ref 8.4–10.5)
Chloride: 101 meq/L (ref 96–112)
Creatinine, Ser: 1.31 mg/dL (ref 0.40–1.50)
GFR: 68.09 mL/min (ref >60.00)
Glucose, Bld: 103 mg/dL — ABNORMAL HIGH (ref 70–99)
Potassium: 4.6 meq/L (ref 3.5–5.1)
Sodium: 138 meq/L (ref 135–145)
Total Bilirubin: 1.8 mg/dL — ABNORMAL HIGH (ref 0.2–1.2)
Total Protein: 7.6 g/dL (ref 6.0–8.3)

## 2023-10-23 LAB — LIPID PANEL
Cholesterol: 279 mg/dL — ABNORMAL HIGH (ref 0–200)
HDL: 40 mg/dL (ref >39.00)
LDL Cholesterol: 198 mg/dL — ABNORMAL HIGH (ref 0–99)
NonHDL: 239
Total CHOL/HDL Ratio: 7
Triglycerides: 206 mg/dL — ABNORMAL HIGH (ref 0.0–149.0)
VLDL: 41.2 mg/dL — ABNORMAL HIGH (ref 0.0–40.0)

## 2023-10-23 LAB — HEMOGLOBIN A1C: Hgb A1c MFr Bld: 6 % (ref 4.6–6.5)

## 2023-10-24 LAB — BILIRUBIN, FRACTIONATED(TOT/DIR/INDIR)
Bilirubin, Direct: 0.2 mg/dL (ref 0.0–0.2)
Indirect Bilirubin: 1.5 mg/dL — ABNORMAL HIGH (ref 0.2–1.2)
Total Bilirubin: 1.7 mg/dL — ABNORMAL HIGH (ref 0.2–1.2)

## 2023-12-02 ENCOUNTER — Other Ambulatory Visit: Payer: Self-pay | Admitting: Family Medicine

## 2023-12-04 IMAGING — US US ABDOMEN LIMITED
1 series · 14 of 25 positions shown · non-contrast
Comparison: None.

CLINICAL DATA: Transaminitis

Elevated bilirubin
EXAM:
ULTRASOUND ABDOMEN LIMITED RIGHT UPPER QUADRANT

[Series 1: us abdomen limited · 14 of 50 slices shown]
[im 1/50]
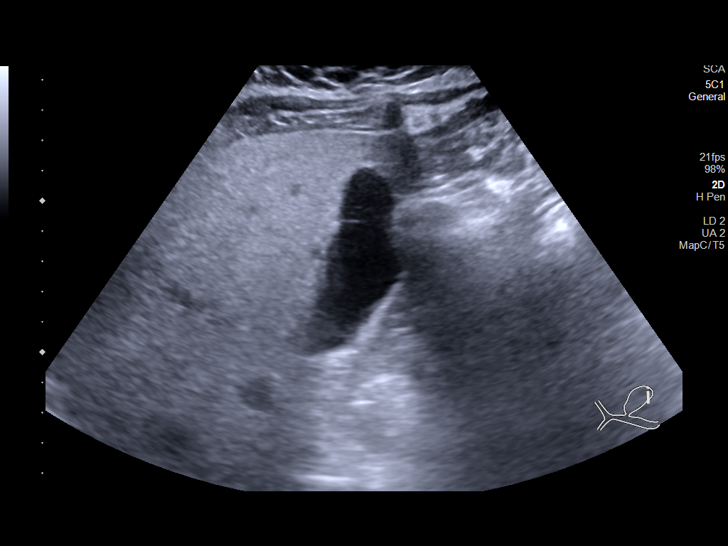
[im 5/50]
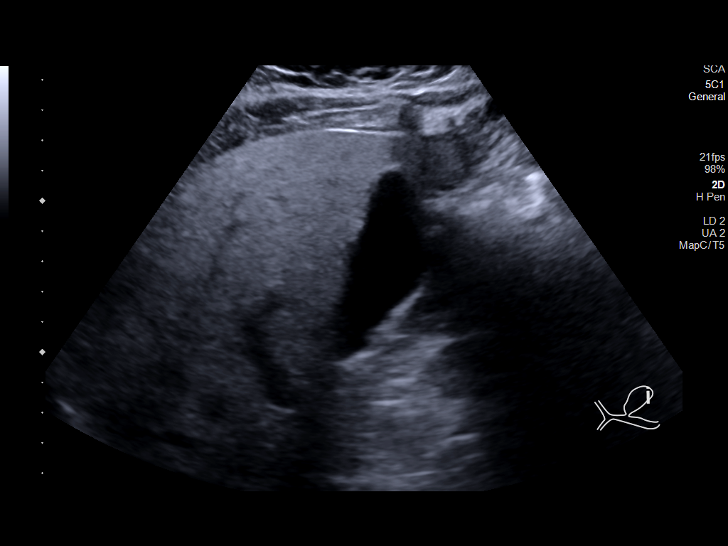
[im 9/50]
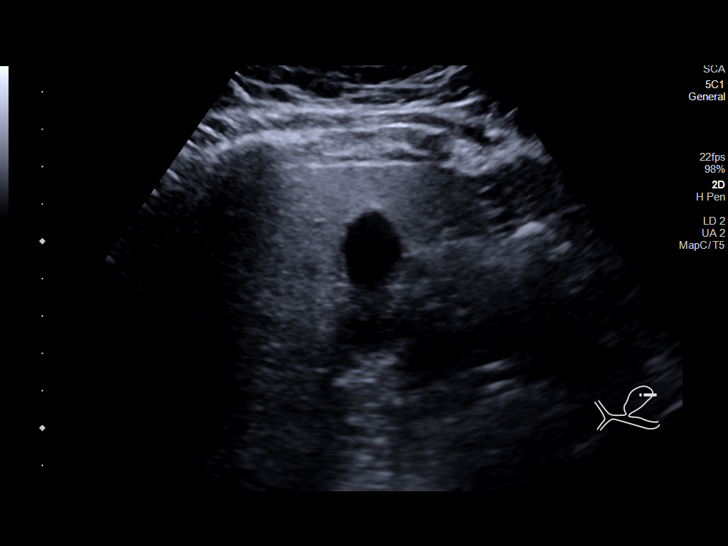
[im 13/50]
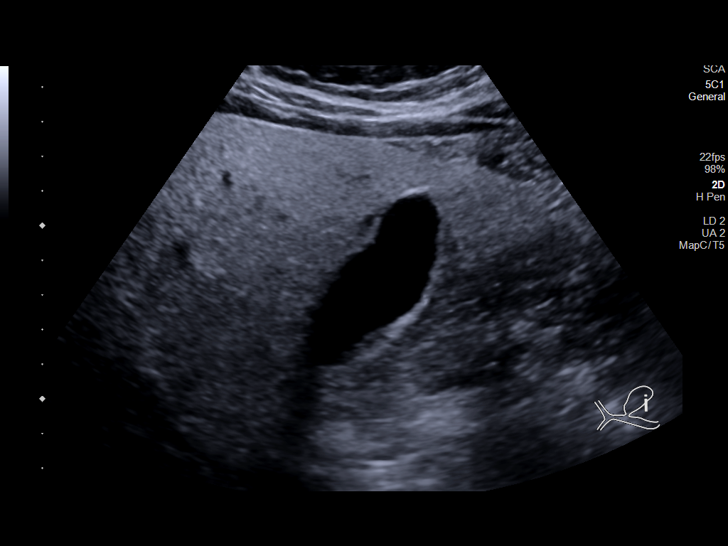
[im 17/50]
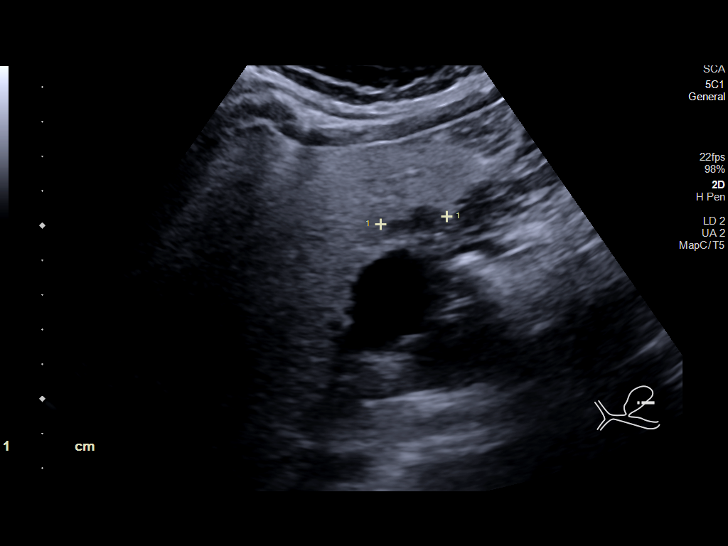
[im 19/50]
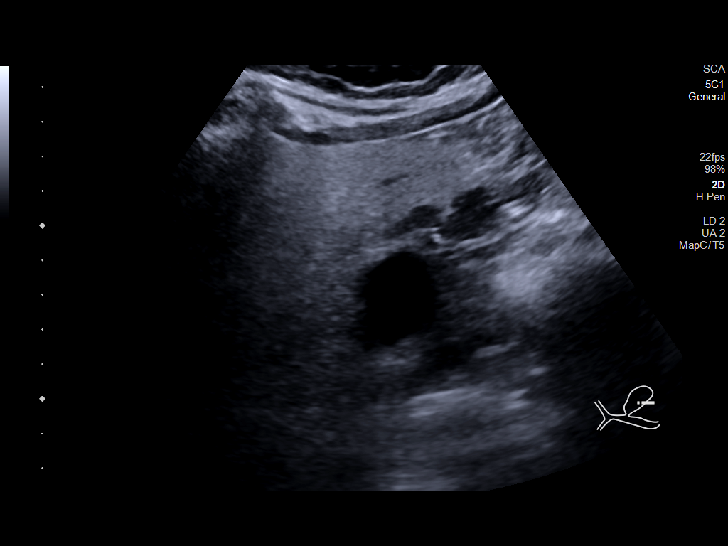
[im 23/50]
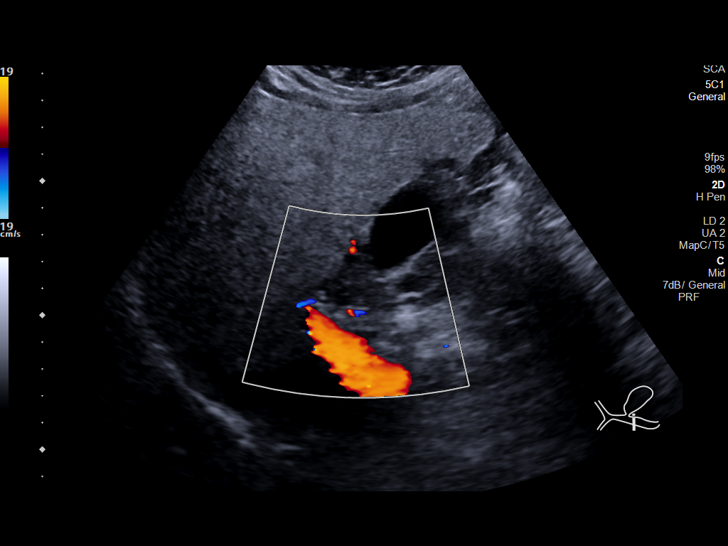
[im 27/50]
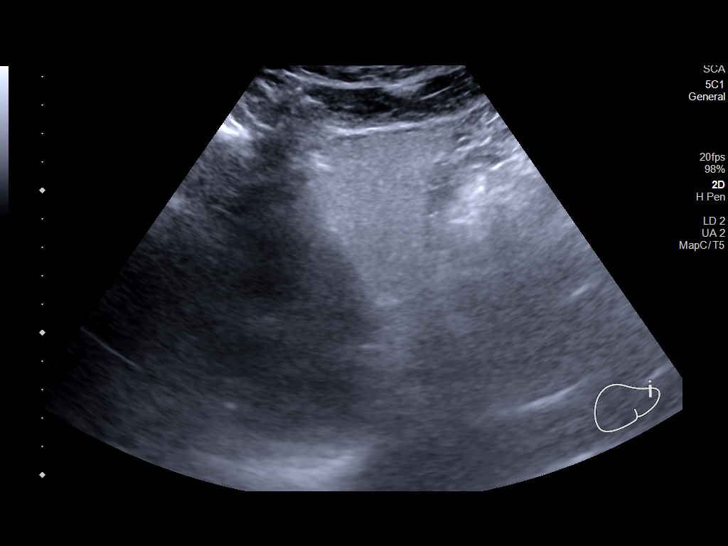
[im 31/50]
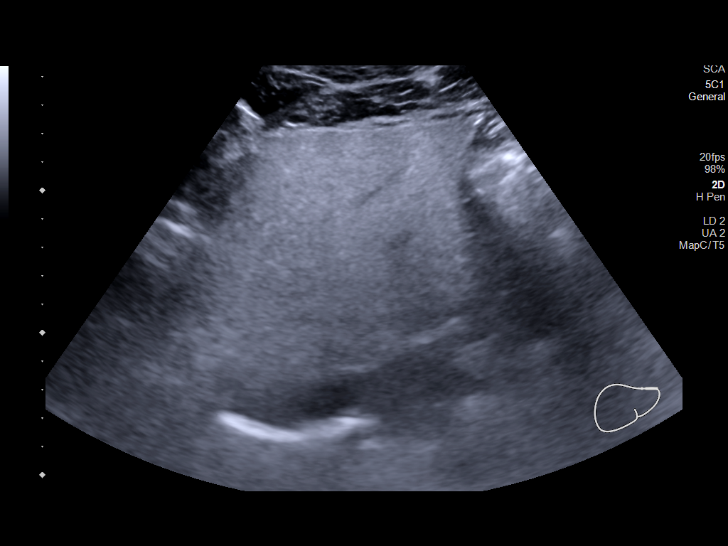
[im 33/50]
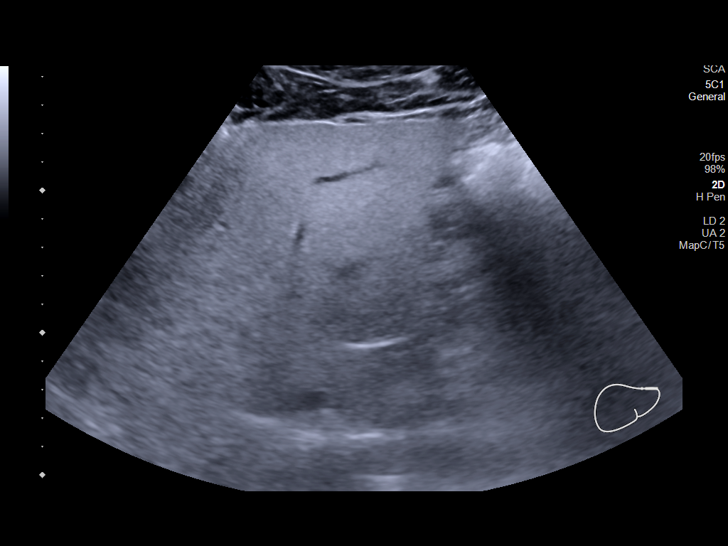
[im 37/50]
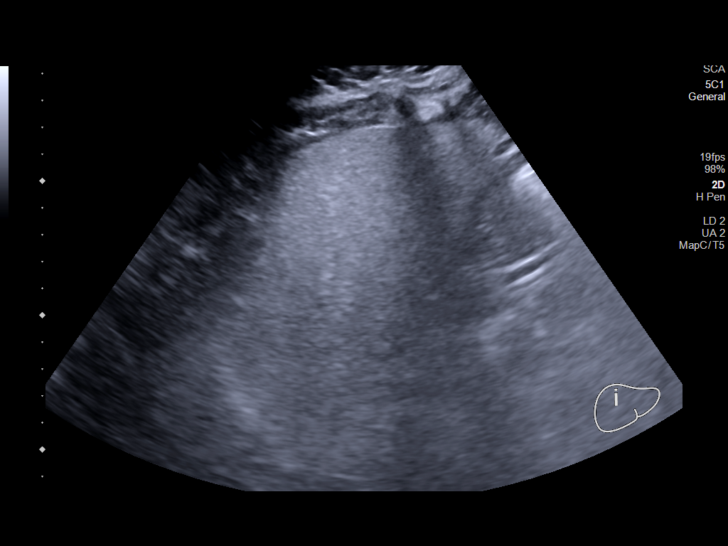
[im 41/50]
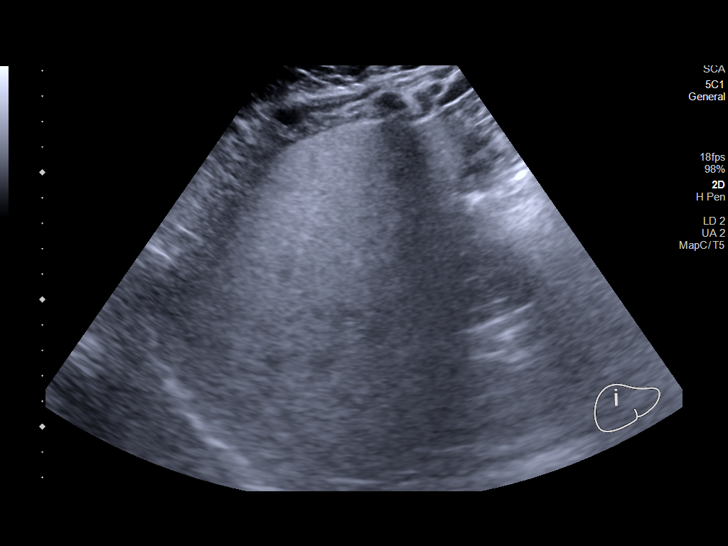
[im 45/50]
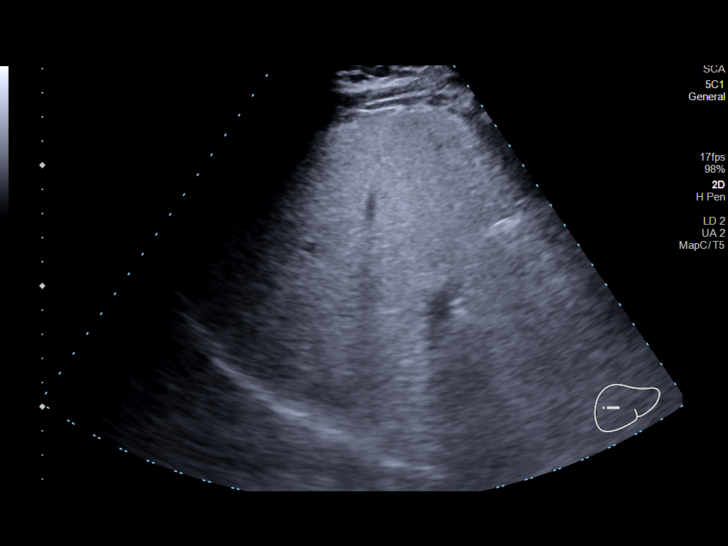
[im 50/50]
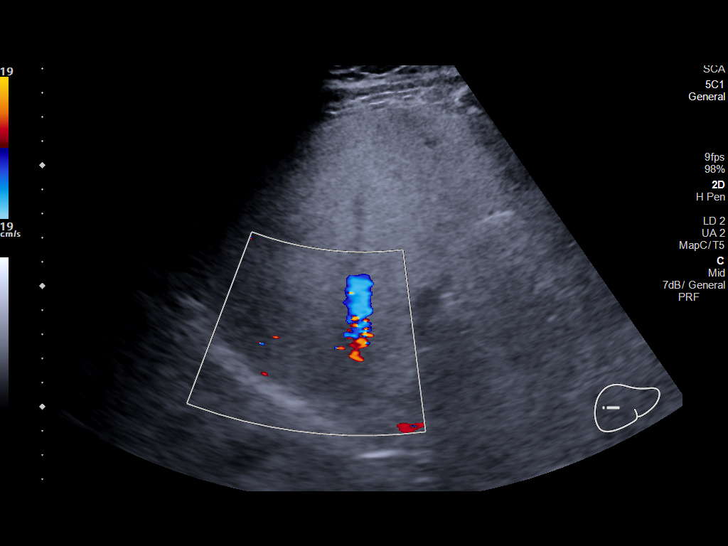

[14 of 25 positions shown; findings below may reference images not displayed]

FINDINGS: Gallbladder:

Gallstones: None

Sludge: None

Gallbladder Wall: Within normal limits

Pericholecystic fluid: None

Sonographic Murphy's Sign: Negative per technologist

Common bile duct:

Diameter: 2 mm mm

Liver:

Parenchymal echogenicity: Diffusely increased echogenicity with
sparing along gallbladder fossa.

Lesions: None

Portal vein: Patent.  Hepatopetal flow

Other: None.
IMPRESSION: Diffuse increased echogenicity of the hepatic parenchyma is a
nonspecific indicator of hepatocellular dysfunction, most commonly
steatosis.

## 2023-12-10 ENCOUNTER — Encounter: Payer: Self-pay | Admitting: Family Medicine

## 2023-12-10 DIAGNOSIS — I10 Essential (primary) hypertension: Secondary | ICD-10-CM

## 2023-12-20 ENCOUNTER — Other Ambulatory Visit (HOSPITAL_BASED_OUTPATIENT_CLINIC_OR_DEPARTMENT_OTHER): Payer: Self-pay

## 2023-12-20 MED ORDER — AMLODIPINE BESYLATE 2.5 MG PO TABS
2.5000 mg | ORAL_TABLET | Freq: Every day | ORAL | 1 refills | Status: DC
Start: 2023-12-20 — End: 2024-01-31
  Filled 2023-12-20: qty 30, 30d supply, fill #0
  Filled 2024-01-29: qty 30, 30d supply, fill #1

## 2024-01-13 ENCOUNTER — Other Ambulatory Visit: Payer: Self-pay | Admitting: Family Medicine

## 2024-01-13 DIAGNOSIS — E119 Type 2 diabetes mellitus without complications: Secondary | ICD-10-CM

## 2024-01-13 DIAGNOSIS — Z7985 Long-term (current) use of injectable non-insulin antidiabetic drugs: Secondary | ICD-10-CM

## 2024-01-14 MED ORDER — SEMAGLUTIDE (1 MG/DOSE) 4 MG/3ML ~~LOC~~ SOPN: 1.0000 mg | PEN_INJECTOR | SUBCUTANEOUS | 0 refills | Status: DC

## 2024-01-31 ENCOUNTER — Other Ambulatory Visit: Payer: Self-pay | Admitting: Neurology

## 2024-01-31 DIAGNOSIS — I10 Essential (primary) hypertension: Secondary | ICD-10-CM

## 2024-01-31 MED ORDER — AMLODIPINE BESYLATE 2.5 MG PO TABS: 2.5000 mg | ORAL_TABLET | Freq: Every day | ORAL | 1 refills | Status: DC

## 2024-04-02 ENCOUNTER — Other Ambulatory Visit: Payer: Self-pay | Admitting: Family Medicine

## 2024-04-02 DIAGNOSIS — Z7985 Long-term (current) use of injectable non-insulin antidiabetic drugs: Secondary | ICD-10-CM

## 2024-04-02 DIAGNOSIS — E119 Type 2 diabetes mellitus without complications: Secondary | ICD-10-CM

## 2024-04-02 MED ORDER — SEMAGLUTIDE (1 MG/DOSE) 4 MG/3ML ~~LOC~~ SOPN
1.0000 mg | PEN_INJECTOR | SUBCUTANEOUS | 0 refills | Status: DC
Start: 2024-04-02 — End: 2024-07-09

## 2024-06-30 ENCOUNTER — Encounter: Payer: Self-pay | Admitting: Family Medicine

## 2024-07-07 NOTE — Progress Notes (Unsigned)
 Subjective:     Patient ID: Steven Powell, male    DOB: [birth year], 43 y.o.   MRN: [id number]  No chief complaint on file.   HPI  Steven Powell is a 43 year old male who presents for follow-up on chronic conditions.  Hypertension Amlodipine  2.5 mg daily  HLD Red yeast rice p.o. daily  Diabetes: - Checking glucose at home:  -Home BS *** - Medications: Semaglutide  1 mg weekly - Compliant with medications - Denies symptoms of hypoglycemia, polyuria, polydipsia, numbness extremities, foot ulcers/trauma, visual changes, wounds that are not healing, medication side effects  Diabetic eye exam-** Weightloss?  Hx of Gout Allopurinol  100 mg-2 tablets daily (200 mg total)  Anxiety Hydroxyzine  25 mg every 8 hours as needed  Patient denies fever, chills, SOB, CP, palpitations, dyspnea, edema, HA, vision changes, N/V/D, abdominal pain, urinary symptoms, rash, weight changes, and recent illness or hospitalizations.   History of Present Illness              Health Maintenance Due  Topic Date Due   FOOT EXAM  Never done   OPHTHALMOLOGY EXAM  Never done   Diabetic kidney evaluation - Urine ACR  Never done   Pneumococcal Vaccine 61-80 Years old (1 of 2 - PCV) Never done   Hepatitis B Vaccines (1 of 3 - 19+ 3-dose series) Never done   HPV VACCINES (1 - Risk 3-dose SCDM series) Never done   HEMOGLOBIN A1C  04/22/2024    Past Medical History:  Diagnosis Date   Allergy    seasonal mostly in spring   Asthma    childhood   Diabetes mellitus type 2, controlled (HCC) 10/01/2017   Gout    left knee   Hyperlipidemia    Hyperlipidemia associated with type 2 diabetes mellitus (HCC) 10/06/2017   Preventative health care 10/06/2017   Pseudogout    left knee    Past Surgical History:  Procedure Laterality Date   KNEE SURGERY     KNEE SURGERY  2014   arthroscopy left knee, found pseudogout, and meniscal tea    Family History  Problem Relation Age of Onset   Hypertension  Mother    Diabetes Father    Heart disease Father    Gout Brother    Hypertension Brother    Stroke Maternal Grandmother    Stroke Maternal Grandfather    Asthma Daughter    Diabetes Other    Hyperlipidemia Other    Hypertension Other     Social History   Socioeconomic History   Marital status: Married    Spouse name: Not on file   Number of children: Not on file   Years of education: Not on file   Highest education level: Bachelor's degree (e.g., BA, AB, BS)  Occupational History   Not on file  Tobacco Use   Smoking status: Never   Smokeless tobacco: Never  Substance and Sexual Activity   Alcohol use: Yes    Alcohol/week: 0.0 standard drinks of alcohol   Drug use: No   Sexual activity: Yes    Partners: Female  Other Topics Concern   Not on file  Social History Narrative   Occupation: currently working [employer] as a Psychologist, occupational   Lives with wife and mom   Never Smoked    Alcohol use-yes (social)   No dietary restrictions, intermittent fasting          Social Drivers of Corporate investment banker Strain: Low Risk  (10/13/2023)   Overall  Financial Resource Strain (CARDIA)    Difficulty of Paying Living Expenses: Not very hard  Food Insecurity: No Food Insecurity (10/13/2023)   Hunger Vital Sign    Worried About Running Out of Food in the Last Year: Never true    Ran Out of Food in the Last Year: Never true  Transportation Needs: No Transportation Needs (10/13/2023)   PRAPARE - Administrator, Civil Service (Medical): No    Lack of Transportation (Non-Medical): No  Physical Activity: Sufficiently Active (10/13/2023)   Exercise Vital Sign    Days of Exercise per Week: 3 days    Minutes of Exercise per Session: 60 min  Stress: No Stress Concern Present (10/13/2023)   [employer] of Occupational Health - Occupational Stress Questionnaire    Feeling of Stress : Not at all  Social Connections: Moderately Integrated (10/13/2023)   Social  Connection and Isolation Panel    Frequency of Communication with Friends and Family: Three times a week    Frequency of Social Gatherings with Friends and Family: Three times a week    Attends Religious Services: More than 4 times per year    Active Member of Clubs or Organizations: No    Attends Engineer, structural: Not on file    Marital Status: Married  Catering manager Violence: Not on file    Outpatient Medications Prior to Visit  Medication Sig Dispense Refill   allopurinol  (ZYLOPRIM ) 100 MG tablet TAKE 2 TABLETS DAILY 180 tablet 3   amLODipine  (NORVASC ) 2.5 MG tablet Take 1 tablet (2.5 mg total) by mouth daily. 90 tablet 1   Blood Glucose Monitoring Suppl DEVI 1 each by Does not apply route in the morning, at noon, and at bedtime. May substitute to any manufacturer covered by patient's insurance. 1 each 0   colchicine  0.6 MG tablet Take 1 tablet (0.6 mg total) by mouth daily. 90 tablet 1   hydrOXYzine  (VISTARIL ) 25 MG capsule Take 1 capsule (25 mg total) by mouth every 8 (eight) hours as needed. 30 capsule 0   levocetirizine (XYZAL ) 5 MG tablet Take 1 tablet (5 mg total) by mouth every evening. 90 tablet 1   meloxicam  (MOBIC ) 7.5 MG tablet Take 1 tablet (7.5 mg total) by mouth daily. 90 tablet 0   predniSONE  (DELTASONE ) 10 MG tablet Take 4 tablets by mouth daily for 2 days, then 3 tablets daily for 2 days, then 2 tablets daily for 2 days, and then 1 tablet daily for 2 days. 20 tablet 0   Probiotic Product (PROBIOTIC-10 PO) Take by mouth.     Red Yeast Rice Extract (RED YEAST RICE PO) Take by mouth.     Semaglutide , 1 MG/DOSE, 4 MG/3ML SOPN Inject 1 mg into the skin once a week. 9 mL 0   No facility-administered medications prior to visit.    No Known Allergies  ROS     Objective:    Physical Exam   There were no vitals taken for this visit. Wt Readings from Last 3 Encounters:  10/14/23 207 lb (93.9 kg)  07/22/23 218 lb (98.9 kg)  07/18/23 218 lb (98.9 kg)        Assessment & Plan:   Problem List Items Addressed This Visit   None   I am having Steven Powell maintain his Red Yeast Rice Extract (RED YEAST RICE PO), Probiotic Product (PROBIOTIC-10 PO), hydrOXYzine , levocetirizine, colchicine , meloxicam , Blood Glucose Monitoring Suppl, predniSONE , allopurinol , amLODipine , and Semaglutide  (1 MG/DOSE).  No orders of  the defined types were placed in this encounter.

## 2024-07-09 ENCOUNTER — Other Ambulatory Visit

## 2024-07-09 ENCOUNTER — Ambulatory Visit: Payer: Self-pay | Admitting: Student

## 2024-07-09 ENCOUNTER — Encounter: Payer: Self-pay | Admitting: Student

## 2024-07-09 ENCOUNTER — Ambulatory Visit: Admitting: Student

## 2024-07-09 VITALS — BP 118/82 | HR 97 | Temp 98.6°F | Resp 12 | Ht 64.0 in | Wt 204.6 lb

## 2024-07-09 DIAGNOSIS — M1000 Idiopathic gout, unspecified site: Secondary | ICD-10-CM

## 2024-07-09 DIAGNOSIS — E785 Hyperlipidemia, unspecified: Secondary | ICD-10-CM | POA: Diagnosis not present

## 2024-07-09 DIAGNOSIS — Z7985 Long-term (current) use of injectable non-insulin antidiabetic drugs: Secondary | ICD-10-CM

## 2024-07-09 DIAGNOSIS — E119 Type 2 diabetes mellitus without complications: Secondary | ICD-10-CM

## 2024-07-09 DIAGNOSIS — I10 Essential (primary) hypertension: Secondary | ICD-10-CM

## 2024-07-09 LAB — CBC WITH DIFFERENTIAL/PLATELET
Basophils Absolute: 0.1 K/uL (ref 0.0–0.1)
Basophils Relative: 1.3 % (ref 0.0–3.0)
Eosinophils Absolute: 0.2 K/uL (ref 0.0–0.7)
Eosinophils Relative: 2.4 % (ref 0.0–5.0)
HCT: 48.1 % (ref 39.0–52.0)
Hemoglobin: 15.9 g/dL (ref 13.0–17.0)
Lymphocytes Relative: 20.6 % (ref 12.0–46.0)
Lymphs Abs: 1.6 K/uL (ref 0.7–4.0)
MCHC: 33.1 g/dL (ref 30.0–36.0)
MCV: 91.7 fl (ref 78.0–100.0)
Monocytes Absolute: 0.7 K/uL (ref 0.1–1.0)
Monocytes Relative: 9.1 % (ref 3.0–12.0)
Neutro Abs: 5.1 K/uL (ref 1.4–7.7)
Neutrophils Relative %: 66.6 % (ref 43.0–77.0)
Platelets: 325 K/uL (ref 150.0–400.0)
RBC: 5.24 Mil/uL (ref 4.22–5.81)
RDW: 13.3 % (ref 11.5–15.5)
WBC: 7.6 K/uL (ref 4.0–10.5)

## 2024-07-09 LAB — COMPREHENSIVE METABOLIC PANEL WITH GFR
ALT: 54 U/L — ABNORMAL HIGH (ref 0–53)
AST: 28 U/L (ref 0–37)
Albumin: 4.9 g/dL (ref 3.5–5.2)
Alkaline Phosphatase: 73 U/L (ref 39–117)
BUN: 13 mg/dL (ref 6–23)
CO2: 26 meq/L (ref 19–32)
Calcium: 9.9 mg/dL (ref 8.4–10.5)
Chloride: 105 meq/L (ref 96–112)
Creatinine, Ser: 1.28 mg/dL (ref 0.40–1.50)
GFR: 69.66 mL/min (ref >60.00)
Glucose, Bld: 109 mg/dL — ABNORMAL HIGH (ref 70–99)
Potassium: 4.5 meq/L (ref 3.5–5.1)
Sodium: 139 meq/L (ref 135–145)
Total Bilirubin: 1.4 mg/dL — ABNORMAL HIGH (ref 0.2–1.2)
Total Protein: 7.9 g/dL (ref 6.0–8.3)

## 2024-07-09 LAB — LIPID PANEL
Cholesterol: 262 mg/dL — ABNORMAL HIGH (ref 0–200)
HDL: 44.5 mg/dL (ref >39.00)
LDL Cholesterol: 163 mg/dL — ABNORMAL HIGH (ref 0–99)
NonHDL: 217.75
Total CHOL/HDL Ratio: 6
Triglycerides: 276 mg/dL — ABNORMAL HIGH (ref 0.0–149.0)
VLDL: 55.2 mg/dL — ABNORMAL HIGH (ref 0.0–40.0)

## 2024-07-09 LAB — MICROALBUMIN / CREATININE URINE RATIO
Creatinine,U: 186.5 mg/dL
Microalb Creat Ratio: 15.6 mg/g (ref 0.0–30.0)
Microalb, Ur: 2.9 mg/dL — ABNORMAL HIGH (ref 0.0–1.9)

## 2024-07-09 LAB — HEMOGLOBIN A1C: Hgb A1c MFr Bld: 6.2 % (ref 4.6–6.5)

## 2024-07-09 LAB — URIC ACID: Uric Acid, Serum: 6.8 mg/dL (ref 4.0–7.8)

## 2024-07-09 LAB — TSH: TSH: 2.54 u[IU]/mL (ref 0.35–5.50)

## 2024-07-09 MED ORDER — SEMAGLUTIDE (2 MG/DOSE) 8 MG/3ML ~~LOC~~ SOPN: 2.0000 mg | PEN_INJECTOR | SUBCUTANEOUS | 0 refills | Status: DC

## 2024-07-09 NOTE — Assessment & Plan Note (Signed)
 Well controlled on current medication. Reports 1 flare in knee that resolved without prn medication, otherwise no flares.

## 2024-07-09 NOTE — Assessment & Plan Note (Addendum)
 Encourage heart healthy diet such as MIND or DASH diet, increase exercise, avoid trans fats, simple carbohydrates and processed foods, consider a krill or fish or flaxseed oil cap daily.  Update labs today, consider lipid lowering medication-will discuss at FU visit.

## 2024-07-09 NOTE — Assessment & Plan Note (Signed)
 Increase semeglutide dose to 2 mg injection a week.  Discussed diet and exercise Follow-up in 3 months

## 2024-07-09 NOTE — Assessment & Plan Note (Signed)
 Well controlled, no changes to meds. Encouraged heart healthy diet such as the DASH diet and exercise as tolerated.

## 2024-07-21 DIAGNOSIS — Z0000 Encounter for general adult medical examination without abnormal findings: Secondary | ICD-10-CM | POA: Insufficient documentation

## 2024-07-21 NOTE — Assessment & Plan Note (Signed)
 Well controlled on current medication. Reports 1 flare in knee that resolved without prn medication, otherwise no flares.

## 2024-07-21 NOTE — Assessment & Plan Note (Signed)
 Well controlled, no changes to meds. Encouraged heart healthy diet such as the DASH diet and exercise as tolerated.

## 2024-07-21 NOTE — Assessment & Plan Note (Signed)
 Encouraged patient to maintain a healthy lifestyle, including regular physical activity, adequate hydration, and a well-balanced diet. Advised limiting processed foods, added sugars, and sodium intake. Reinforced the importance of sleep, stress management, and routine preventive care.

## 2024-07-21 NOTE — Assessment & Plan Note (Signed)
 Stable on current medications

## 2024-07-21 NOTE — Assessment & Plan Note (Signed)
 Stable on semeglutide dose to 2 mg injection a week.  hgba1c acceptable, minimize simple carbs. Increase exercise as tolerated. Continue current meds   Lab Results  Component Value Date   HGBA1C 6.2 07/09/2024

## 2024-07-21 NOTE — Progress Notes (Unsigned)
 Subjective:     Patient ID: Steven Powell, male    DOB: 07-20-1981, 43 y.o.   MRN: [id number]  No chief complaint on file.   HPI  Steven Powell is a 43 year old male who presents for annual physical. Overall patient reports feeling well, does feel that his diet and physical activity could be improved upon not the best and is not exercise much as he would like but is working on improving this.      Steven Powell a 43 y.o.  male/male***presents today for a complete physical exam. Pt reports consuming a {diet types:17450} diet. {types:19826} Pt generally feels {DESC; WELL/FAIRLY WELL/POORLY:18703}. Reports sleeping {DESC; WELL/FAIRLY WELL/POORLY:18703}.  {does/does not:200015} have additional problems to discuss today.   New Surgeries or Family Hx: ***Yes/ Denies Habits: ETOH, illicit drugs, smoking, secondhand exposure, caffeine     Hypertension Amlodipine  2.5 mg daily BP Readings from Last 1 Encounters:  07/09/24 118/82     HLD Red yeast rice- reports he is not taking.  Diabetes: - Checking glucose at home: yes -Fasting BS; low 90s- 105 - Medications: Semaglutide  2 mg weekly Side effects: - Compliant with medications - Denies symptoms of hypoglycemia, polyuria, polydipsia, numbness extremities, foot ulcers/trauma, visual changes, wounds that are not healing, medication side effects   Lab Results  Component Value Date   HGBA1C 6.2 07/09/2024     Hx of Gout Allopurinol  100 mg-2 tablets daily (200 mg total)  Anxiety Hydroxyzine  25 mg every 8 hours as needed    HCM:   Immunizations: HPV, Hep B, PNA due Vision: Ophthalmology- DM eye exam  Report compliance with medications.  Patient denies fever, chills, SOB, CP, palpitations, dyspnea, edema, HA, vision changes, N/V/D, abdominal pain, urinary symptoms, rash, weight changes, and recent illness or hospitalizations.   History of Present Illness              Health Maintenance Due  Topic Date Due    FOOT EXAM  Never done   OPHTHALMOLOGY EXAM  Never done   Pneumococcal Vaccine 50-60 Years old (1 of 2 - PCV) Never done   Hepatitis B Vaccines (1 of 3 - 19+ 3-dose series) Never done   HPV VACCINES (1 - Risk 3-dose SCDM series) Never done    Past Medical History:  Diagnosis Date   Allergy    seasonal mostly in spring   Asthma    childhood   Diabetes mellitus type 2, controlled (HCC) 10/01/2017   Gout    left knee   Hyperlipidemia    Hyperlipidemia associated with type 2 diabetes mellitus (HCC) 10/06/2017   Preventative health care 10/06/2017   Pseudogout    left knee    Past Surgical History:  Procedure Laterality Date   KNEE SURGERY     KNEE SURGERY  2014   arthroscopy left knee, found pseudogout, and meniscal tea    Family History  Problem Relation Age of Onset   Hypertension Mother    Diabetes Father    Heart disease Father    Gout Brother    Hypertension Brother    Stroke Maternal Grandmother    Stroke Maternal Grandfather    Asthma Daughter    Diabetes Other    Hyperlipidemia Other    Hypertension Other     Social History   Socioeconomic History   Marital status: Married    Spouse name: Not on file   Number of children: Not on file   Years of education: Not on file   Highest education  level: Bachelor's degree (e.g., BA, AB, BS)  Occupational History   Not on file  Tobacco Use   Smoking status: Never   Smokeless tobacco: Never  Substance and Sexual Activity   Alcohol use: Yes    Alcohol/week: 0.0 standard drinks of alcohol   Drug use: No   Sexual activity: Yes    Partners: Female  Other Topics Concern   Not on file  Social History Narrative   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   Never Smoked    Alcohol use-yes (social)   No dietary restrictions, intermittent fasting          Social Drivers of Corporate investment banker Strain: Low Risk  (07/08/2024)   Overall Financial Resource Strain (CARDIA)     Difficulty of Paying Living Expenses: Not hard at all  Food Insecurity: No Food Insecurity (07/08/2024)   Hunger Vital Sign    Worried About Running Out of Food in the Last Year: Never true    Ran Out of Food in the Last Year: Never true  Transportation Needs: No Transportation Needs (07/08/2024)   PRAPARE - Administrator, Civil Service (Medical): No    Lack of Transportation (Non-Medical): No  Physical Activity: Insufficiently Active (07/08/2024)   Exercise Vital Sign    Days of Exercise per Week: 2 days    Minutes of Exercise per Session: 40 min  Stress: No Stress Concern Present (07/08/2024)   [employer] of Occupational Health - Occupational Stress Questionnaire    Feeling of Stress: Not at all  Social Connections: Moderately Integrated (07/08/2024)   Social Connection and Isolation Panel    Frequency of Communication with Friends and Family: More than three times a week    Frequency of Social Gatherings with Friends and Family: Twice a week    Attends Religious Services: More than 4 times per year    Active Member of [employer] or Organizations: No    Attends Engineer, structural: Not on file    Marital Status: Married  Catering manager Violence: Not on file    Outpatient Medications Prior to Visit  Medication Sig Dispense Refill   allopurinol  (ZYLOPRIM ) 100 MG tablet TAKE 2 TABLETS DAILY 180 tablet 3   amLODipine  (NORVASC ) 2.5 MG tablet Take 1 tablet (2.5 mg total) by mouth daily. 90 tablet 1   Blood Glucose Monitoring Suppl DEVI 1 each by Does not apply route in the morning, at noon, and at bedtime. May substitute to any manufacturer covered by patient's insurance. 1 each 0   colchicine  0.6 MG tablet Take 1 tablet (0.6 mg total) by mouth daily. 90 tablet 1   hydrOXYzine  (VISTARIL ) 25 MG capsule Take 1 capsule (25 mg total) by mouth every 8 (eight) hours as needed. 30 capsule 0   levocetirizine (XYZAL ) 5 MG tablet Take 1 tablet (5 mg total) by mouth every  evening. 90 tablet 1   meloxicam  (MOBIC ) 7.5 MG tablet Take 1 tablet (7.5 mg total) by mouth daily. 90 tablet 0   predniSONE  (DELTASONE ) 10 MG tablet Take 4 tablets by mouth daily for 2 days, then 3 tablets daily for 2 days, then 2 tablets daily for 2 days, and then 1 tablet daily for 2 days. (Patient not taking: Reported on 07/09/2024) 20 tablet 0   Probiotic Product (PROBIOTIC-10 PO) Take by mouth.     Red Yeast Rice Extract (RED YEAST RICE PO) Take by mouth.  Semaglutide , 2 MG/DOSE, 8 MG/3ML SOPN Inject 2 mg as directed once a week. 9 mL 0   No facility-administered medications prior to visit.    No Known Allergies  ROS See HPI    Objective:    Physical Exam Constitutional:      General: He is not in acute distress.    Appearance: He is not ill-appearing, toxic-appearing or diaphoretic.  HENT:     Head: Normocephalic and atraumatic.     Right Ear: Tympanic membrane, ear canal and external ear normal.     Left Ear: Tympanic membrane, ear canal and external ear normal.     Nose: Nose normal. No congestion.     Mouth/Throat:     Mouth: Mucous membranes are moist.     Pharynx: Oropharynx is clear.  Eyes:     Extraocular Movements: Extraocular movements intact.     Right eye: Normal extraocular motion.     Left eye: Normal extraocular motion.     Conjunctiva/sclera: Conjunctivae normal.     Pupils: Pupils are equal, round, and reactive to light.  Neck:     Thyroid : No thyroid  mass or thyromegaly.     Vascular: No carotid bruit or JVD.  Cardiovascular:     Rate and Rhythm: Normal rate and regular rhythm.     Pulses: Normal pulses.          Radial pulses are 2+ on the right side and 2+ on the left side.       Dorsalis pedis pulses are 2+ on the right side and 2+ on the left side.     Heart sounds: Normal heart sounds, S1 normal and S2 normal. No murmur heard.    No friction rub. No gallop.  Pulmonary:     Effort: Pulmonary effort is normal. No respiratory distress.      Breath sounds: Normal breath sounds.  Abdominal:     General: Bowel sounds are normal. There is no distension.     Palpations: Abdomen is soft.     Tenderness: There is no abdominal tenderness. There is no guarding.  Musculoskeletal:        General: Normal range of motion.     Cervical back: Full passive range of motion without pain and normal range of motion. No edema or erythema.     Right lower leg: No edema.     Left lower leg: No edema.  Lymphadenopathy:     Cervical: No cervical adenopathy.  Skin:    General: Skin is warm and dry.     Capillary Refill: Capillary refill takes less than 2 seconds.  Neurological:     General: No focal deficit present.     Mental Status: He is alert and oriented to person, place, and time.     Cranial Nerves: No cranial nerve deficit.     Motor: No weakness.     Coordination: Coordination normal.     Gait: Gait normal.     Deep Tendon Reflexes: Reflexes normal.  Psychiatric:        Mood and Affect: Mood normal.        Behavior: Behavior normal.        Thought Content: Thought content normal.     General: No acute distress. Awake and conversant.  Eyes: Normal conjunctiva, anicteric. Round symmetric pupils.  ENT: Hearing grossly intact. No nasal discharge.  Respiratory: CTAB. Respirations are non-labored. No wheezing.  Skin: Warm. No rashes or ulcers.  Psych: Alert and oriented. Cooperative, Appropriate mood  and affect, Normal judgment.  CV: RRR. No murmur. No lower extremity edema.  MSK: Normal ambulation. No clubbing or cyanosis.  Neuro:  CN II-XII grossly normal.    There were no vitals taken for this visit. Wt Readings from Last 3 Encounters:  07/09/24 204 lb 9.6 oz (92.8 kg)  10/14/23 207 lb (93.9 kg)  07/22/23 218 lb (98.9 kg)   The 10-year ASCVD risk score (Arnett DK, et al., 2019) is: 4.9%   Values used to calculate the score:     Age: 55 years     Clincally relevant sex: Male     Is Non-Hispanic African American: No      Diabetic: Yes     Tobacco smoker: No     Systolic Blood Pressure: 118 mmHg     Is BP treated: Yes     HDL Cholesterol: 44.5 mg/dL     Total Cholesterol: 262 mg/dL     Assessment & Plan:   Problem List Items Addressed This Visit     Annual visit for general adult medical examination without abnormal findings - Primary   Encouraged patient to maintain a healthy lifestyle, including regular physical activity, adequate hydration, and a well-balanced diet. Advised limiting processed foods, added sugars, and sodium intake. Reinforced the importance of sleep, stress management, and routine preventive care.       Anxiety   Stable on current medications.      Gout   Well controlled on current medication. Reports 1 flare in knee that resolved without prn medication, otherwise no flares.       Hyperlipidemia (Chronic)   Discussed stain medication- will start Atorvastatin  10 mg daily.Medication and common side effects reviewed with the patient; patient voiced understanding and had no further questions at this time.   Encourage heart healthy diet such as MIND or DASH diet, increase exercise, avoid trans fats, simple carbohydrates and processed foods, consider a krill or fish or flaxseed oil cap daily.        Hypertension   Well controlled, no changes to meds. Encouraged heart healthy diet such as the DASH diet and exercise as tolerated.        Type 2 diabetes mellitus without complication, without long-term current use of insulin (HCC) (Chronic)   Stable on semeglutide dose to 2 mg injection a week.  hgba1c acceptable, minimize simple carbs. Increase exercise as tolerated. Continue current meds   Lab Results  Component Value Date   HGBA1C 6.2 07/09/2024           I am having Steven Powell maintain his Red Yeast Rice Extract (RED YEAST RICE PO), Probiotic Product (PROBIOTIC-10 PO), hydrOXYzine , levocetirizine, colchicine , meloxicam , Blood Glucose Monitoring Suppl, predniSONE ,  allopurinol , amLODipine , and Semaglutide  (2 MG/DOSE).  No orders of the defined types were placed in this encounter.

## 2024-07-21 NOTE — Assessment & Plan Note (Signed)
 Lab Results  Component Value Date   CHOL 262 (H) 07/09/2024   HDL 44.50 07/09/2024   LDLCALC 163 (H) 07/09/2024   LDLDIRECT 185.0 10/20/2018   TRIG 276.0 (H) 07/09/2024   CHOLHDL 6 07/09/2024    Start Pravastatin  20 mg daily. Medication and common side effects reviewed with the patient; patient voiced understanding and had no further questions at this time.  Encourage heart healthy diet such as MIND or DASH diet, increase exercise, avoid trans fats, simple carbohydrates and processed foods, consider a krill or fish or flaxseed oil cap daily.

## 2024-07-23 ENCOUNTER — Encounter: Payer: Self-pay | Admitting: *Deleted

## 2024-07-23 ENCOUNTER — Telehealth: Payer: Self-pay | Admitting: Family Medicine

## 2024-07-23 ENCOUNTER — Encounter: Payer: Self-pay | Admitting: Student

## 2024-07-23 ENCOUNTER — Ambulatory Visit (INDEPENDENT_AMBULATORY_CARE_PROVIDER_SITE_OTHER): Admitting: Student

## 2024-07-23 VITALS — BP 116/80 | HR 92 | Temp 98.2°F | Resp 12 | Ht 64.0 in | Wt 203.4 lb

## 2024-07-23 DIAGNOSIS — E782 Mixed hyperlipidemia: Secondary | ICD-10-CM | POA: Diagnosis not present

## 2024-07-23 DIAGNOSIS — M1000 Idiopathic gout, unspecified site: Secondary | ICD-10-CM

## 2024-07-23 DIAGNOSIS — Z0000 Encounter for general adult medical examination without abnormal findings: Secondary | ICD-10-CM | POA: Diagnosis not present

## 2024-07-23 DIAGNOSIS — Z7985 Long-term (current) use of injectable non-insulin antidiabetic drugs: Secondary | ICD-10-CM

## 2024-07-23 DIAGNOSIS — I10 Essential (primary) hypertension: Secondary | ICD-10-CM

## 2024-07-23 DIAGNOSIS — E119 Type 2 diabetes mellitus without complications: Secondary | ICD-10-CM

## 2024-07-23 DIAGNOSIS — F419 Anxiety disorder, unspecified: Secondary | ICD-10-CM

## 2024-07-23 MED ORDER — PRAVASTATIN SODIUM 20 MG PO TABS: 20.0000 mg | ORAL_TABLET | Freq: Every day | ORAL | 0 refills | Status: DC

## 2024-07-23 NOTE — Telephone Encounter (Signed)
 Pt dropped off biometric screening form papers to be filled out by pcp. Placed in pcps box. Please call pt when papers have been faxed.

## 2024-07-23 NOTE — Telephone Encounter (Signed)
 Form handed to Steven Powell CHOL

## 2024-08-20 ENCOUNTER — Encounter: Payer: Self-pay | Admitting: Student

## 2024-09-25 ENCOUNTER — Other Ambulatory Visit: Payer: Self-pay | Admitting: Student

## 2024-09-25 DIAGNOSIS — E119 Type 2 diabetes mellitus without complications: Secondary | ICD-10-CM

## 2024-09-28 ENCOUNTER — Other Ambulatory Visit: Payer: Self-pay | Admitting: Family Medicine

## 2024-09-28 ENCOUNTER — Encounter: Payer: Self-pay | Admitting: Student

## 2024-09-28 DIAGNOSIS — E119 Type 2 diabetes mellitus without complications: Secondary | ICD-10-CM

## 2024-09-28 DIAGNOSIS — Z7985 Long-term (current) use of injectable non-insulin antidiabetic drugs: Secondary | ICD-10-CM

## 2024-10-05 MED ORDER — OZEMPIC (2 MG/DOSE) 8 MG/3ML ~~LOC~~ SOPN: 2.0000 mg | PEN_INJECTOR | SUBCUTANEOUS | 0 refills | Status: DC

## 2024-11-26 ENCOUNTER — Other Ambulatory Visit: Payer: Self-pay | Admitting: Family Medicine

## 2024-11-28 ENCOUNTER — Encounter: Payer: Self-pay | Admitting: *Deleted

## 2024-12-29 ENCOUNTER — Encounter: Payer: Self-pay | Admitting: Student

## 2024-12-29 ENCOUNTER — Other Ambulatory Visit: Payer: Self-pay | Admitting: *Deleted

## 2024-12-29 DIAGNOSIS — E119 Type 2 diabetes mellitus without complications: Secondary | ICD-10-CM

## 2024-12-29 MED ORDER — OZEMPIC (2 MG/DOSE) 8 MG/3ML ~~LOC~~ SOPN: 2.0000 mg | PEN_INJECTOR | SUBCUTANEOUS | 0 refills | Status: DC

## 2025-01-19 NOTE — Progress Notes (Unsigned)
 "  Subjective:     Patient ID: Steven Powell, male    DOB: Mar 26, 1981, 44 y.o.   MRN: [id number]  No chief complaint on file.   HPI  Discussed the use of AI scribe software for clinical note transcription with the patient, who gave verbal consent to proceed.  Hypertension Amlodipine  2.5 mg daily  HLD Red yeast rice- reports he is not taking  Diabetes: - Checking glucose at home: yes -Fasting BS; ** - Medications: Semaglutide  2 mg weekly Side effects: Denies - Compliant with medications - Denies symptoms of hypoglycemia, polyuria, polydipsia, numbness extremities, foot ulcers/trauma, visual changes, wounds that are not healing, medication side effects    Wt Readings from Last 3 Encounters:  07/23/24 203 lb 6.4 oz (92.3 kg)  07/09/24 204 lb 9.6 oz (92.8 kg)  10/14/23 207 lb (93.9 kg)     Hx of Gout Allopurinol  100 mg-2 tablets daily (200 mg total)   Anxiety Hydroxyzine  25 mg every 8 hours as needed   Report compliance with medications.   Patient denies fever, chills, SOB, CP, palpitations, dyspnea, edema, HA, vision changes, N/V/D, abdominal pain, urinary symptoms, rash, weight changes, and recent illness or hospitalizations.      Health Maintenance Due  Topic Date Due   Hepatitis B Vaccines 19-59 Average Risk (1 of 3 - 19+ 3-dose series) Never done   Diabetic kidney evaluation - Urine ACR  01/09/2025   HEMOGLOBIN A1C  01/09/2025    Past Medical History:  Diagnosis Date   Allergy    seasonal mostly in spring   Asthma    childhood   Diabetes mellitus type 2, controlled (HCC) 10/01/2017   Gout    left knee   Hyperlipidemia    Hyperlipidemia associated with type 2 diabetes mellitus (HCC) 10/06/2017   Preventative health care 10/06/2017   Pseudogout    left knee    Past Surgical History:  Procedure Laterality Date   KNEE SURGERY     KNEE SURGERY  2014   arthroscopy left knee, found pseudogout, and meniscal tea    Family History  Problem Relation Age  of Onset   Hypertension Mother    Diabetes Father    Heart disease Father    Gout Brother    Hypertension Brother    Stroke Maternal Grandmother    Stroke Maternal Grandfather    Asthma Daughter    Diabetes Other    Hyperlipidemia Other    Hypertension Other     Social History   Socioeconomic History   Marital status: Married    Spouse name: Not on file   Number of children: Not on file   Years of education: Not on file   Highest education level: Bachelor's degree (e.g., BA, AB, BS)  Occupational History   Not on file  Tobacco Use   Smoking status: Never   Smokeless tobacco: Never  Substance and Sexual Activity   Alcohol use: Yes    Alcohol/week: 0.0 standard drinks of alcohol   Drug use: No   Sexual activity: Yes    Partners: Female  Other Topics Concern   Not on file  Social History Narrative   Occupation: currently working [employer] as a psychologist, occupational   Lives with wife and mom   Never Smoked    Alcohol use-yes (social)   No dietary restrictions, intermittent fasting          Social Drivers of Health   Tobacco Use: Low Risk (10/06/2024)   Received from [facility]  Patient History    Smoking Tobacco Use: Never    Smokeless Tobacco Use: Never    Passive Exposure: Not on file  Financial Resource Strain: Low Risk (07/08/2024)   Overall Financial Resource Strain (CARDIA)    Difficulty of Paying Living Expenses: Not hard at all  Food Insecurity: No Food Insecurity (07/08/2024)   Epic    Worried About Programme Researcher, Broadcasting/film/video in the Last Year: Never true    Ran Out of Food in the Last Year: Never true  Transportation Needs: No Transportation Needs (07/08/2024)   Epic    Lack of Transportation (Medical): No    Lack of Transportation (Non-Medical): No  Physical Activity: Insufficiently Active (07/08/2024)   Exercise Vital Sign    Days of Exercise per Week: 2 days    Minutes of Exercise per Session: 40 min  Stress: No Stress Concern Present (07/08/2024)   [employer]  of Occupational Health - Occupational Stress Questionnaire    Feeling of Stress: Not at all  Social Connections: Moderately Integrated (07/08/2024)   Social Connection and Isolation Panel    Frequency of Communication with Friends and Family: More than three times a week    Frequency of Social Gatherings with Friends and Family: Twice a week    Attends Religious Services: More than 4 times per year    Active Member of Clubs or Organizations: No    Attends Engineer, Structural: Not on file    Marital Status: Married  Catering Manager Violence: Not on file  Depression (PHQ2-9): Low Risk (07/23/2024)   Depression (PHQ2-9)    PHQ-2 Score: 1  Alcohol Screen: Low Risk (07/08/2024)   Alcohol Screen    Last Alcohol Screening Score (AUDIT): 2  Housing: Low Risk (07/08/2024)   Epic    Unable to Pay for Housing in the Last Year: No    Number of Times Moved in the Last Year: 0    Homeless in the Last Year: No  Utilities: Not on file  Health Literacy: Not on file    Outpatient Medications Prior to Visit  Medication Sig Dispense Refill   allopurinol  (ZYLOPRIM ) 100 MG tablet TAKE 2 TABLETS DAILY 180 tablet 0   amLODipine  (NORVASC ) 2.5 MG tablet Take 1 tablet (2.5 mg total) by mouth daily. 90 tablet 1   Blood Glucose Monitoring Suppl DEVI 1 each by Does not apply route in the morning, at noon, and at bedtime. May substitute to any manufacturer covered by patient's insurance. 1 each 0   colchicine  0.6 MG tablet Take 1 tablet (0.6 mg total) by mouth daily. 90 tablet 1   hydrOXYzine  (VISTARIL ) 25 MG capsule Take 1 capsule (25 mg total) by mouth every 8 (eight) hours as needed. 30 capsule 0   levocetirizine (XYZAL ) 5 MG tablet Take 1 tablet (5 mg total) by mouth every evening. 90 tablet 1   meloxicam  (MOBIC ) 7.5 MG tablet Take 1 tablet (7.5 mg total) by mouth daily. 90 tablet 0   predniSONE  (DELTASONE ) 10 MG tablet Take 4 tablets by mouth daily for 2 days, then 3 tablets daily for 2 days, then 2  tablets daily for 2 days, and then 1 tablet daily for 2 days. (Patient not taking: Reported on 07/23/2024) 20 tablet 0   Probiotic Product (PROBIOTIC-10 PO) Take by mouth.     Semaglutide , 2 MG/DOSE, (OZEMPIC , 2 MG/DOSE,) 8 MG/3ML SOPN Inject 2 mg into the skin once a week. 9 mL 0   No facility-administered medications prior to  visit.    Allergies[1]  ROS     Objective:    Physical Exam Vitals reviewed.  Constitutional:      General: He is not in acute distress.    Appearance: He is not toxic-appearing.  HENT:     Head: Normocephalic and atraumatic.     Mouth/Throat:     Mouth: Mucous membranes are moist.     Pharynx: Oropharynx is clear.  Eyes:     Pupils: Pupils are equal, round, and reactive to light.  Cardiovascular:     Rate and Rhythm: Normal rate and regular rhythm.     Pulses: Normal pulses.     Heart sounds: Normal heart sounds. No murmur heard. Pulmonary:     Effort: Pulmonary effort is normal. No respiratory distress.     Breath sounds: Normal breath sounds. No wheezing.  Musculoskeletal:        General: No swelling.     Cervical back: Neck supple.  Skin:    General: Skin is warm and dry.  Neurological:     General: No focal deficit present.     Mental Status: He is alert and oriented to person, place, and time.  Psychiatric:        Mood and Affect: Mood normal.        Behavior: Behavior normal.        Thought Content: Thought content normal.        Judgment: Judgment normal.      There were no vitals taken for this visit. Wt Readings from Last 3 Encounters:  07/23/24 203 lb 6.4 oz (92.3 kg)  07/09/24 204 lb 9.6 oz (92.8 kg)  10/14/23 207 lb (93.9 kg)       Assessment & Plan:   Problem List Items Addressed This Visit       Cardiovascular and Mediastinum   Hypertension     Respiratory   OSA (obstructive sleep apnea)     Endocrine   Type 2 diabetes mellitus without complication, without long-term current use of insulin (HCC) (Chronic)      Other   Anxiety   Gout - Primary   Hyperlipidemia (Chronic)    I am having Steven Powell maintain his Probiotic Product (PROBIOTIC-10 PO), hydrOXYzine , levocetirizine, colchicine , meloxicam , Blood Glucose Monitoring Suppl, predniSONE , amLODipine , allopurinol , and Ozempic  (2 MG/DOSE).  No orders of the defined types were placed in this encounter.     [1]  Allergies Allergen Reactions   Pravastatin      Muscle aches   "

## 2025-01-21 ENCOUNTER — Ambulatory Visit: Admitting: Student

## 2025-01-21 ENCOUNTER — Encounter: Payer: Self-pay | Admitting: Student

## 2025-01-21 VITALS — BP 127/76 | HR 90 | Resp 16 | Ht 64.0 in | Wt 199.0 lb

## 2025-01-21 DIAGNOSIS — E782 Mixed hyperlipidemia: Secondary | ICD-10-CM

## 2025-01-21 DIAGNOSIS — M1000 Idiopathic gout, unspecified site: Secondary | ICD-10-CM | POA: Diagnosis not present

## 2025-01-21 DIAGNOSIS — F419 Anxiety disorder, unspecified: Secondary | ICD-10-CM | POA: Diagnosis not present

## 2025-01-21 DIAGNOSIS — E119 Type 2 diabetes mellitus without complications: Secondary | ICD-10-CM

## 2025-01-21 DIAGNOSIS — G4733 Obstructive sleep apnea (adult) (pediatric): Secondary | ICD-10-CM

## 2025-01-21 DIAGNOSIS — Z7985 Long-term (current) use of injectable non-insulin antidiabetic drugs: Secondary | ICD-10-CM

## 2025-01-21 DIAGNOSIS — I10 Essential (primary) hypertension: Secondary | ICD-10-CM

## 2025-01-21 DIAGNOSIS — Z1159 Encounter for screening for other viral diseases: Secondary | ICD-10-CM

## 2025-01-21 MED ORDER — ALLOPURINOL 100 MG PO TABS: 200.0000 mg | ORAL_TABLET | Freq: Every day | ORAL | 1 refills | Status: AC

## 2025-01-21 MED ORDER — AMLODIPINE BESYLATE 2.5 MG PO TABS: 2.5000 mg | ORAL_TABLET | Freq: Every day | ORAL | 1 refills | Status: AC

## 2025-01-21 NOTE — Assessment & Plan Note (Signed)
 Well controlled, no changes to meds. Encouraged heart healthy diet such as the DASH diet and exercise as tolerated.

## 2025-01-21 NOTE — Assessment & Plan Note (Signed)
Statin intolerant. Encourage heart healthy diet such as MIND or DASH diet, increase exercise, avoid trans fats, simple carbohydrates and processed foods, consider a krill or fish or flaxseed oil cap daily.

## 2025-01-21 NOTE — Assessment & Plan Note (Addendum)
 Well-managed with Ozempic . Discussed potential switch to Mounjaro for weight loss benefits. He plans to call insurance company to see if covered and will let me know. If approved with d/c Ozempic  and start Mounjaro 5 mg daily. - Ordered A1c and UACR - If approved, initiate Mounjaro at 5 mg, follow-up in 3 months. If not approved, FU 6 months CPE - Continue lifestyle modifications, including diet and exercise. - Ensure adequate protein intake.

## 2025-01-21 NOTE — Assessment & Plan Note (Signed)
Denies recent flares.

## 2025-01-21 NOTE — Assessment & Plan Note (Signed)
"  Uses CPAP nightly.         "

## 2025-01-21 NOTE — Assessment & Plan Note (Signed)
 Stable on current medications

## 2025-01-22 ENCOUNTER — Telehealth: Payer: Self-pay

## 2025-01-22 ENCOUNTER — Other Ambulatory Visit: Payer: Self-pay

## 2025-01-22 ENCOUNTER — Encounter: Payer: Self-pay | Admitting: Student

## 2025-01-22 ENCOUNTER — Other Ambulatory Visit (HOSPITAL_BASED_OUTPATIENT_CLINIC_OR_DEPARTMENT_OTHER): Payer: Self-pay

## 2025-01-22 ENCOUNTER — Ambulatory Visit: Payer: Self-pay | Admitting: Student

## 2025-01-22 ENCOUNTER — Other Ambulatory Visit (HOSPITAL_COMMUNITY): Payer: Self-pay

## 2025-01-22 LAB — MICROALBUMIN / CREATININE URINE RATIO
Creatinine,U: 120.3 mg/dL
Microalb Creat Ratio: UNDETERMINED mg/g (ref 0.0–30.0)
Microalb, Ur: <0.7 mg/dL

## 2025-01-22 LAB — HEMOGLOBIN A1C: Hgb A1c MFr Bld: 5.9 % (ref 4.6–6.5)

## 2025-01-22 LAB — HEPATITIS B SURFACE ANTIBODY, QUANTITATIVE: Hep B S AB Quant (Post): <5 m[IU]/mL — ABNORMAL LOW

## 2025-01-22 MED ORDER — TIRZEPATIDE 5 MG/0.5ML ~~LOC~~ SOAJ
5.0000 mg | SUBCUTANEOUS | 2 refills | Status: DC
  Filled 2025-01-22: qty 2, 28d supply, fill #0

## 2025-01-22 MED ORDER — TIRZEPATIDE 5 MG/0.5ML ~~LOC~~ SOAJ: 5.0000 mg | SUBCUTANEOUS | 2 refills | Status: AC

## 2025-01-22 NOTE — Telephone Encounter (Signed)
 Pharmacy Patient Advocate Encounter   Received notification from [facility] that prior authorization for Mounjaro is required/requested.   Insurance verification completed.   The patient is insured through [employer].   Per test claim: PA required; PA submitted to above mentioned insurance via Latent Key/confirmation #/EOC [id number] Status is pending

## 2025-01-22 NOTE — Telephone Encounter (Signed)
 Pharmacy Patient Advocate Encounter  Received notification from [facility] that Prior Authorization for Mounjaro has been APPROVED from 01/22/2025 to 01/22/2026. Ran test claim, Copay is $25.00. This test claim was processed through [facility]- copay amounts may vary at other pharmacies due to pharmacy/plan contracts, or as the patient moves through the different stages of their insurance plan.   PA #/Case ID/Reference #: [id number]
